# Patient Record
Sex: Female | Born: 1975
Health system: Southern US, Community
[De-identification: ages and names within clinical notes are randomized; demographics above are authoritative.]

## PROBLEM LIST (undated history)

## (undated) DIAGNOSIS — T7840XA Allergy, unspecified, initial encounter: Secondary | ICD-10-CM

## (undated) DIAGNOSIS — E785 Hyperlipidemia, unspecified: Secondary | ICD-10-CM

## (undated) DIAGNOSIS — E079 Disorder of thyroid, unspecified: Secondary | ICD-10-CM

## (undated) DIAGNOSIS — D649 Anemia, unspecified: Secondary | ICD-10-CM

## (undated) DIAGNOSIS — G43909 Migraine, unspecified, not intractable, without status migrainosus: Secondary | ICD-10-CM

## (undated) DIAGNOSIS — B019 Varicella without complication: Secondary | ICD-10-CM

## (undated) DIAGNOSIS — F419 Anxiety disorder, unspecified: Secondary | ICD-10-CM

## (undated) DIAGNOSIS — F329 Major depressive disorder, single episode, unspecified: Secondary | ICD-10-CM

## (undated) DIAGNOSIS — IMO0001 Reserved for inherently not codable concepts without codable children: Secondary | ICD-10-CM

## (undated) DIAGNOSIS — F32A Depression, unspecified: Secondary | ICD-10-CM

## (undated) HISTORY — DX: Anxiety disorder, unspecified: F41.9

## (undated) HISTORY — DX: Major depressive disorder, single episode, unspecified: F32.9

## (undated) HISTORY — DX: Allergy, unspecified, initial encounter: T78.40XA

## (undated) HISTORY — DX: Migraine, unspecified, not intractable, without status migrainosus: G43.909

## (undated) HISTORY — DX: Reserved for inherently not codable concepts without codable children: IMO0001

## (undated) HISTORY — PX: WISDOM TOOTH EXTRACTION: SHX21

## (undated) HISTORY — DX: Disorder of thyroid, unspecified: E07.9

## (undated) HISTORY — DX: Depression, unspecified: F32.A

## (undated) HISTORY — DX: Anemia, unspecified: D64.9

## (undated) HISTORY — DX: Hyperlipidemia, unspecified: E78.5

## (undated) HISTORY — DX: Varicella without complication: B01.9

---
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
## 2013-03-30 ENCOUNTER — Ambulatory Visit: Payer: Self-pay | Admitting: Medical

## 2013-08-28 ENCOUNTER — Ambulatory Visit (INDEPENDENT_AMBULATORY_CARE_PROVIDER_SITE_OTHER): Payer: BC Managed Care – PPO | Admitting: Internal Medicine

## 2013-08-28 ENCOUNTER — Other Ambulatory Visit (HOSPITAL_COMMUNITY)
Admission: RE | Admit: 2013-08-28 | Discharge: 2013-08-28 | Disposition: A | Discharge status: Home / Self Care | Payer: BC Managed Care – PPO | Source: Ambulatory Visit | Attending: Internal Medicine | Admitting: Internal Medicine

## 2013-08-28 ENCOUNTER — Encounter: Payer: Self-pay | Admitting: Internal Medicine

## 2013-08-28 VITALS — BP 120/76 | HR 77 | Temp 98.0°F | Ht 62.0 in | Wt 128.5 lb

## 2013-08-28 DIAGNOSIS — Z0000 Encounter for general adult medical examination without abnormal findings: Secondary | ICD-10-CM

## 2013-08-28 DIAGNOSIS — E039 Hypothyroidism, unspecified: Secondary | ICD-10-CM | POA: Insufficient documentation

## 2013-08-28 DIAGNOSIS — Z1151 Encounter for screening for human papillomavirus (HPV): Secondary | ICD-10-CM | POA: Insufficient documentation

## 2013-08-28 DIAGNOSIS — A499 Bacterial infection, unspecified: Secondary | ICD-10-CM

## 2013-08-28 DIAGNOSIS — B379 Candidiasis, unspecified: Secondary | ICD-10-CM

## 2013-08-28 DIAGNOSIS — J309 Allergic rhinitis, unspecified: Secondary | ICD-10-CM | POA: Insufficient documentation

## 2013-08-28 DIAGNOSIS — B9689 Other specified bacterial agents as the cause of diseases classified elsewhere: Secondary | ICD-10-CM

## 2013-08-28 DIAGNOSIS — N760 Acute vaginitis: Secondary | ICD-10-CM

## 2013-08-28 DIAGNOSIS — Z124 Encounter for screening for malignant neoplasm of cervix: Secondary | ICD-10-CM

## 2013-08-28 MED ORDER — LEVOTHYROXINE SODIUM 112 MCG PO TABS: 112.0000 ug | ORAL_TABLET | Freq: Every day | ORAL | Status: DC

## 2013-08-28 MED ORDER — ETONOGESTREL-ETHINYL ESTRADIOL 0.12-0.015 MG/24HR VA RING: VAGINAL_RING | VAGINAL | Status: DC

## 2013-08-28 MED ORDER — ONDANSETRON HCL 8 MG PO TABS: 8.0000 mg | ORAL_TABLET | ORAL | Status: DC | PRN

## 2013-08-28 MED ORDER — FLUCONAZOLE 150 MG PO TABS: 150.0000 mg | ORAL_TABLET | Freq: Once | ORAL | Status: DC

## 2013-08-28 MED ORDER — FLUTICASONE PROPIONATE 50 MCG/ACT NA SUSP: 2.0000 | Freq: Every day | NASAL | Status: DC

## 2013-08-28 MED ORDER — METRONIDAZOLE 0.75 % EX GEL: 1.0000 "application " | Freq: Two times a day (BID) | CUTANEOUS | Status: DC

## 2013-08-28 MED ORDER — BUDESONIDE-FORMOTEROL FUMARATE 160-4.5 MCG/ACT IN AERO: 2.0000 | INHALATION_SPRAY | Freq: Two times a day (BID) | RESPIRATORY_TRACT | Status: DC

## 2013-08-28 NOTE — Addendum Note (Signed)
Addended by: Lurlean Nanny on: 08/28/2013 04:49 PM   Modules accepted: Orders

## 2013-08-28 NOTE — Progress Notes (Signed)
Pre visit review using our clinic review tool, if applicable. No additional management support is needed unless otherwise documented below in the visit note. 

## 2013-08-28 NOTE — Progress Notes (Signed)
HPI Pt presents to the clinic today to establish care. She moved from [place] in August 2014. She has not established care since that time. She does have some concerns today about vaginal discharge, itching and odor. She noticed this 1 week ago. She denies abnormal bleeding or pain with intercourse.  Flu: 02/2013 Tetanus: 2011 Pap Smear: 2012 Eye doctor: yearly Dentist: biannually  Past Medical History  Diagnosis Date  . Allergy   . Depression   . Thyroid disease   . Migraines   . Chicken pox     Current Outpatient Prescriptions  Medication Sig Dispense Refill  . budesonide-formoterol (SYMBICORT) 160-4.5 MCG/ACT inhaler Inhale 2 puffs into the lungs 2 (two) times daily.      . Cholecalciferol (VITAMIN D3) 2000 UNITS TABS Take 2 capsules by mouth. 2 times a week      . DiphenhydrAMINE HCl (BENADRYL ALLERGY PO) Take 1 capsule by mouth as needed.      . etonogestrel-ethinyl estradiol (NUVARING) 0.12-0.015 MG/24HR vaginal ring Place 1 each vaginally every 28 (twenty-eight) days. Insert vaginally and leave in place for 3 consecutive weeks, then remove for 1 week.      . fexofenadine (ALLEGRA) 180 MG tablet Take 180 mg by mouth daily.      . Flaxseed, Linseed, (FLAXSEED OIL PO) Take 1 capsule by mouth 2 (two) times daily.      Derald Macleod Factor (INTRINSI B12-FOLATE) 409-811-91 MCG-MCG-MG TABS Take 1 capsule by mouth daily.      Marland Kitchen ibuprofen (ADVIL,MOTRIN) 400 MG tablet Take 400 mg by mouth as needed.      Marland Kitchen levothyroxine (SYNTHROID, LEVOTHROID) 112 MCG tablet Take 112 mcg by mouth daily before breakfast.      . MAGNESIUM GLYCINATE PLUS PO Take 1 tablet by mouth daily.      Marland Kitchen NIACINAMIDE-ZINC-COPPER-FA PO Take 1 capsule by mouth daily.      . ondansetron (ZOFRAN) 8 MG tablet Take 8 mg by mouth as needed for nausea or vomiting.       No current facility-administered medications for this visit.    No Known Allergies  Family History  Problem Relation Age of Onset  . Heart  disease Father   . Hypertension Father   . Stroke Maternal Grandmother   . Stroke Paternal Grandfather   . Cancer Neg Hx   . Diabetes Neg Hx     History   Social History  . Marital Status: Married    Spouse Name: N/A    Number of Children: N/A  . Years of Education: N/A   Occupational History  . Not on file.   Social History Main Topics  . Smoking status: Never Smoker   . Smokeless tobacco: Not on file  . Alcohol Use: 3.0 oz/week    5 Glasses of wine per week     Comment: moderate  . Drug Use: No  . Sexual Activity: Yes   Other Topics Concern  . Not on file   Social History Narrative  . No narrative on file    ROS:  Constitutional: Denies fever, malaise, fatigue, headache or abrupt weight changes.  HEENT: Denies eye pain, eye redness, ear pain, ringing in the ears, wax buildup, runny nose, nasal congestion, bloody nose, or sore throat. Respiratory: Denies difficulty breathing, shortness of breath, cough or sputum production.   Cardiovascular: Denies chest pain, chest tightness, palpitations or swelling in the hands or feet.  Gastrointestinal: Denies abdominal pain, bloating, constipation, diarrhea or blood in the stool.  GU:  Pt reports vaginal odor and discharge. Denies frequency, urgency, pain with urination, blood in urine. Musculoskeletal: Denies decrease in range of motion, difficulty with gait, muscle pain or joint pain and swelling.  Skin: Denies redness, rashes, lesions or ulcercations.  Neurological: Denies dizziness, difficulty with memory, difficulty with speech or problems with balance and coordination.   No other specific complaints in a complete review of systems (except as listed in HPI above).  PE:  BP 120/76  Pulse 77  Temp(Src) 98 F (36.7 C) (Oral)  Ht 5\' 2"  (1.575 m)  Wt 128 lb 8 oz (58.287 kg)  BMI 23.50 kg/m2  SpO2 98%  LMP 08/04/2013 Wt Readings from Last 3 Encounters:  08/28/13 128 lb 8 oz (58.287 kg)    Constitutional:  Alert,  oriented x 4, well developed, well nourished in no apparent distress. Skin: Skin is warm and dry.  No erythema, lesion or ulceration noted. HEENT: Head: normal shape and size; Eyes: sclera white, no icterus, conjunctiva pink, PERRLA and EOMs intact; Ears: Tm's gray and intact, normal light reflex; Nose: mucosa pink and moist, septum midline; Throat/Mouth: Teeth present, , mucosa pink and moist, no lesions or ulcerations noted. Neck: Normal range of motion. Neck supple, trachea midline. No massses, lumps or thyromegaly present.  Cardiovascular: Normal rate and rhythm. S1,S2 noted.  No murmur, rubs or gallops noted. No JVD or BLE edema. No carotid bruits noted. Pulmonary/Chest: Normal effort and positive vesicular breath sounds. No respiratory distress. No wheezes, rales or ronchi noted.  Abdomen: Soft and nontender. Normal bowel sounds, no bruits noted. No distention or masses noted. Liver, spleen and kidneys non palpable. Genitourinary: Normal female anatomy. Uterus midline, anterior and soft. No CMT . Some cervical erosion noted. Thin white discharge noted with positive odor.  Adenexa non palpable. Breast without lumps or masses.  Musculoskeletal: Normal range of motion. Patient exhibits no effusions.  Neurological: Alert and oriented. Cranial nerves II-XII intact. Coordination normal. +DTRs bilaterally. Psychiatric: She has a normal mood and affect. Behavior is normal. Judgment and thought content normal.      Assessment and Plan:  Preventative Health Maintenance:  She brought labs for me to review, all normal Pap smear obtained today Wet prep: pos yeast and clue cells  RTC in 6 months or sooner if needed

## 2013-08-28 NOTE — Patient Instructions (Addendum)

## 2013-08-31 ENCOUNTER — Telehealth: Payer: Self-pay

## 2013-08-31 ENCOUNTER — Other Ambulatory Visit: Payer: Self-pay | Admitting: Internal Medicine

## 2013-08-31 MED ORDER — METRONIDAZOLE 0.75 % VA GEL: 1.0000 | Freq: Two times a day (BID) | VAGINAL | Status: DC

## 2013-08-31 NOTE — Telephone Encounter (Signed)
Pt left v/m;pt was seen 08/28/13 and thought a suppository was going to be sent to pharmacy; when picked up med it was a topical cream and pt wanted to verify she is to use cream instead of suppository. Pt request cb.

## 2013-08-31 NOTE — Telephone Encounter (Signed)
She needs to internal cream/suppository. I have called it in

## 2013-08-31 NOTE — Telephone Encounter (Signed)
Left detailed msg on VM per HIPAA letting pt know her Rx was sent to the pharmacy

## 2013-09-02 ENCOUNTER — Telehealth: Payer: Self-pay

## 2013-09-02 NOTE — Telephone Encounter (Signed)
PA has been sent through [email or web address]--awaiting response

## 2013-09-09 ENCOUNTER — Telehealth: Payer: Self-pay

## 2013-09-09 NOTE — Telephone Encounter (Signed)
Insurance denied Symbicort and states pt must try and fail Advair Diskus or Breo Ellipta--please advise--denial placed in your inbox for your review--i do not need the notification

## 2013-09-10 NOTE — Telephone Encounter (Signed)
Try advair dose 250/50 one inhalation BID, 1 inhaler 2 refills

## 2013-09-11 MED ORDER — FLUTICASONE-SALMETEROL 250-50 MCG/DOSE IN AEPB: 1.0000 | INHALATION_SPRAY | Freq: Two times a day (BID) | RESPIRATORY_TRACT | Status: DC

## 2013-09-11 NOTE — Addendum Note (Signed)
Addended by: Lurlean Nanny on: 09/11/2013 04:39 PM   Modules accepted: Orders

## 2013-09-11 NOTE — Telephone Encounter (Signed)
New Rx sent to pharmacy

## 2013-09-14 NOTE — Telephone Encounter (Signed)
Error

## 2013-10-26 ENCOUNTER — Other Ambulatory Visit: Payer: Self-pay

## 2013-10-26 DIAGNOSIS — J309 Allergic rhinitis, unspecified: Secondary | ICD-10-CM

## 2013-10-26 MED ORDER — FLUTICASONE PROPIONATE 50 MCG/ACT NA SUSP: 2.0000 | Freq: Every day | NASAL | Status: DC

## 2013-10-26 NOTE — Telephone Encounter (Signed)
Requesting 90 day--sent to pharmacy with no additional refills

## 2013-11-23 ENCOUNTER — Ambulatory Visit (INDEPENDENT_AMBULATORY_CARE_PROVIDER_SITE_OTHER): Payer: BC Managed Care – PPO | Admitting: Internal Medicine

## 2013-11-23 ENCOUNTER — Encounter: Payer: Self-pay | Admitting: Internal Medicine

## 2013-11-23 VITALS — BP 112/78 | HR 86 | Temp 98.6°F | Wt 133.0 lb

## 2013-11-23 DIAGNOSIS — R21 Rash and other nonspecific skin eruption: Secondary | ICD-10-CM | POA: Insufficient documentation

## 2013-11-23 MED ORDER — HYDROCORTISONE 2.5 % EX CREA: TOPICAL_CREAM | Freq: Three times a day (TID) | CUTANEOUS | Status: DC | PRN

## 2013-11-23 NOTE — Assessment & Plan Note (Signed)
Doesn't look like contact and not sunburn Most likely photosensitivity rash in spots where sun protection wasn't adequate  Will give stronger cortisone cream reassured

## 2013-11-23 NOTE — Progress Notes (Signed)
Subjective:    Patient ID: Rachael Holloway, female    DOB: June 16, 1975, 38 y.o.   MRN: [id number]  HPI Awoke 2 days ago with raised itchy rash On face and back of neck Some on chest  No further spreading since initial presentation  Did try new sunscreen 3 days ago Rakes some leaves under magnolias---no clear touch with plants Also made batch of pesto---no known problem with pine nuts No rash on arms---despite using the sun screen there  Tried sarna cream and OTC hydrocortisone  Current Outpatient Prescriptions on File Prior to Visit  Medication Sig Dispense Refill  . Cholecalciferol (VITAMIN D3) 2000 UNITS TABS Take 2 capsules by mouth. 2 times a week      . DiphenhydrAMINE HCl (BENADRYL ALLERGY PO) Take 1 capsule by mouth as needed.      . etonogestrel-ethinyl estradiol (NUVARING) 0.12-0.015 MG/24HR vaginal ring Insert vaginally and leave in place for 3 consecutive weeks, then remove for 1 week.  3 each  5  . fexofenadine (ALLEGRA) 180 MG tablet Take 180 mg by mouth daily.      . Flaxseed, Linseed, (FLAXSEED OIL PO) Take 1 capsule by mouth 2 (two) times daily.      . fluticasone (FLONASE) 50 MCG/ACT nasal spray Place 2 sprays into both nostrils daily.  48 g  0  . Folate-B12-Intrinsic Factor (INTRINSI B12-FOLATE) 219-758-83 MCG-MCG-MG TABS Take 1 capsule by mouth daily.      Marland Kitchen ibuprofen (ADVIL,MOTRIN) 400 MG tablet Take 400 mg by mouth as needed.      Marland Kitchen levothyroxine (SYNTHROID, LEVOTHROID) 112 MCG tablet Take 1 tablet (112 mcg total) by mouth daily before breakfast.  30 tablet  5  . MAGNESIUM GLYCINATE PLUS PO Take 1 tablet by mouth daily.      Marland Kitchen NIACINAMIDE-ZINC-COPPER-FA PO Take 1 capsule by mouth daily.      . ondansetron (ZOFRAN) 8 MG tablet Take 1 tablet (8 mg total) by mouth as needed for nausea or vomiting.  20 tablet  1   No current facility-administered medications on file prior to visit.    No Known Allergies  Past Medical History  Diagnosis Date  . Allergy   .  Depression   . Thyroid disease   . Migraines   . Chicken pox     Past Surgical History  Procedure Laterality Date  . Ear tube removal  1985    Family History  Problem Relation Age of Onset  . Heart disease Father   . Hypertension Father   . Stroke Maternal Grandmother   . Stroke Paternal Grandfather   . Cancer Neg Hx   . Diabetes Neg Hx     History   Social History  . Marital Status: Married    Spouse Name: N/A    Number of Children: N/A  . Years of Education: N/A   Occupational History  . Not on file.   Social History Main Topics  . Smoking status: Never Smoker   . Smokeless tobacco: Never Used  . Alcohol Use: 3.0 oz/week    5 Glasses of wine per week     Comment: moderate  . Drug Use: No  . Sexual Activity: Yes   Other Topics Concern  . Not on file   Social History Narrative  . No narrative on file   Review of Systems Does tend to be sensitive to the sun    Objective:   Physical Exam  Constitutional: She appears well-developed and well-nourished. No distress.  Skin:  Confluent red, palpable but not really raised rash--only right cheek, arc on upper back (like T-shirt line) and just above breasts in midline          Assessment & Plan:

## 2013-11-23 NOTE — Progress Notes (Signed)
Pre visit review using our clinic review tool, if applicable. No additional management support is needed unless otherwise documented below in the visit note. 

## 2014-01-25 ENCOUNTER — Other Ambulatory Visit: Payer: Self-pay | Admitting: Internal Medicine

## 2014-03-12 ENCOUNTER — Telehealth: Payer: Self-pay

## 2014-03-12 NOTE — Telephone Encounter (Signed)
If she had the appropriate labs (THS and free T4) done at [facility], will not need new labs, she should bring her labs with her. Ok to give 1 month supply of synthroid so that she does not run out.

## 2014-03-12 NOTE — Telephone Encounter (Signed)
Pt states she understands--pt has not had blood work done already--she was just trying to understand if she needed labs before her appt etc..the patient advised to keep appt and i will send in 30 day supply--pt voiced understanding

## 2014-03-12 NOTE — Telephone Encounter (Signed)
Pt has 2 weeks of levothyroxine and request refill; pt was to schedule 6 mth f/u from 08/28/13 appt. Pt scheduled appt 03/19/14 at 2:15 pm with Webb Silversmith NP; (1st appt that was convenient for pt). Pt request cb; pt wants to know if will need labs prior to appt; pt has labs done at [facility]. Please advise.

## 2014-03-19 ENCOUNTER — Ambulatory Visit (INDEPENDENT_AMBULATORY_CARE_PROVIDER_SITE_OTHER): Payer: BC Managed Care – PPO | Admitting: Internal Medicine

## 2014-03-19 ENCOUNTER — Encounter: Payer: Self-pay | Admitting: Internal Medicine

## 2014-03-19 VITALS — BP 124/72 | HR 78 | Temp 98.7°F | Wt 134.5 lb

## 2014-03-19 DIAGNOSIS — G43909 Migraine, unspecified, not intractable, without status migrainosus: Secondary | ICD-10-CM | POA: Insufficient documentation

## 2014-03-19 DIAGNOSIS — F32A Depression, unspecified: Secondary | ICD-10-CM

## 2014-03-19 DIAGNOSIS — G43019 Migraine without aura, intractable, without status migrainosus: Secondary | ICD-10-CM

## 2014-03-19 DIAGNOSIS — Z3009 Encounter for other general counseling and advice on contraception: Secondary | ICD-10-CM

## 2014-03-19 DIAGNOSIS — F329 Major depressive disorder, single episode, unspecified: Secondary | ICD-10-CM

## 2014-03-19 DIAGNOSIS — F419 Anxiety disorder, unspecified: Secondary | ICD-10-CM

## 2014-03-19 DIAGNOSIS — Z0000 Encounter for general adult medical examination without abnormal findings: Secondary | ICD-10-CM

## 2014-03-19 DIAGNOSIS — Z1239 Encounter for other screening for malignant neoplasm of breast: Secondary | ICD-10-CM

## 2014-03-19 DIAGNOSIS — E038 Other specified hypothyroidism: Secondary | ICD-10-CM

## 2014-03-19 MED ORDER — LEVOTHYROXINE SODIUM 112 MCG PO TABS: 112.0000 ug | ORAL_TABLET | Freq: Every day | ORAL | Status: DC

## 2014-03-19 MED ORDER — NORETHINDRONE 0.35 MG PO TABS: 1.0000 | ORAL_TABLET | Freq: Every day | ORAL | Status: DC

## 2014-03-19 MED ORDER — ONDANSETRON HCL 8 MG PO TABS: 8.0000 mg | ORAL_TABLET | ORAL | Status: DC | PRN

## 2014-03-19 NOTE — Addendum Note (Signed)
Addended by: Jearld Fenton on: 03/19/2014 03:16 PM   Modules accepted: Orders

## 2014-03-19 NOTE — Assessment & Plan Note (Signed)
Sinus related Controlled on flonase and antihistamine

## 2014-03-19 NOTE — Progress Notes (Signed)
Subjective:    Patient ID: Rachael Holloway, female    DOB: 03/13/1976, 38 y.o.   MRN: [id number]  HPI  Pt presents to the clinic today for 6 month followup.  Depression: Reports it may be slightly worse. Thinks it may due to decreased libido. Has very low desire to have sex with her husband. Has been on Lexapro in the past. Weaned herself of of it 1 year ago. Does not want to be started back on any medication at this time.  Hypothyroidism: Has noticed some weight gain but otherwise stable on current dose of synthroid.  Migraines: Sinus related. Worse when the weather changes. Takes antihistamine and Flonase daily which helps.  Contraceptive counseling: has noticed frequent yeast infections since starting Nuvaring. Also had BV which is something she had never had before. OCP's typically cause severe nausea. Has heard about mini pill and is interested in trying that.  Also, she has turned 38 since I last saw her. She was wondering if she could go ahead and get her mammogram done as opposed to waiting until her physical exam next year to schedule it. She does have a history of breast cancer in her first cousin.  Review of Systems      Past Medical History  Diagnosis Date  . Allergy   . Depression   . Thyroid disease   . Migraines   . Chicken pox     Current Outpatient Prescriptions  Medication Sig Dispense Refill  . Cholecalciferol (VITAMIN D3) 2000 UNITS TABS Take 2 capsules by mouth. 2 times a week      . DiphenhydrAMINE HCl (BENADRYL ALLERGY PO) Take 1 capsule by mouth as needed.      . etonogestrel-ethinyl estradiol (NUVARING) 0.12-0.015 MG/24HR vaginal ring Insert vaginally and leave in place for 3 consecutive weeks, then remove for 1 week.  3 each  5  . fexofenadine (ALLEGRA) 180 MG tablet Take 180 mg by mouth daily.      . Flaxseed, Linseed, (FLAXSEED OIL PO) Take 1 capsule by mouth 2 (two) times daily.      . fluticasone (FLONASE) 50 MCG/ACT nasal spray Place 2 sprays  into both nostrils daily.  16 g  1  . Folate-B12-Intrinsic Factor (INTRINSI B12-FOLATE) 768-088-11 MCG-MCG-MG TABS Take 1 capsule by mouth daily.      . hydrocortisone 2.5 % cream Apply topically 3 (three) times daily as needed.  30 g  0  . ibuprofen (ADVIL,MOTRIN) 400 MG tablet Take 400 mg by mouth as needed.      Marland Kitchen levothyroxine (SYNTHROID, LEVOTHROID) 112 MCG tablet Take 1 tablet (112 mcg total) by mouth daily before breakfast.  30 tablet  0  . MAGNESIUM GLYCINATE PLUS PO Take 1 tablet by mouth daily.      Marland Kitchen NIACINAMIDE-ZINC-COPPER-FA PO Take 1 capsule by mouth daily.      . ondansetron (ZOFRAN) 8 MG tablet Take 1 tablet (8 mg total) by mouth as needed for nausea or vomiting.  20 tablet  1   No current facility-administered medications for this visit.    No Known Allergies  Family History  Problem Relation Age of Onset  . Heart disease Father   . Hypertension Father   . Stroke Maternal Grandmother   . Stroke Paternal Grandfather   . Cancer Neg Hx   . Diabetes Neg Hx     History   Social History  . Marital Status: Married    Spouse Name: N/A    Number of Children:  N/A  . Years of Education: N/A   Occupational History  . Not on file.   Social History Main Topics  . Smoking status: Never Smoker   . Smokeless tobacco: Never Used  . Alcohol Use: 3.0 oz/week    5 Glasses of wine per week     Comment: moderate  . Drug Use: No  . Sexual Activity: Yes   Other Topics Concern  . Not on file   Social History Narrative  . No narrative on file     Constitutional: Denies fever, malaise, fatigue, headache or abrupt weight changes.  HEENT: Denies eye pain, eye redness, ear pain, ringing in the ears, wax buildup, runny nose, nasal congestion, bloody nose, or sore throat. Respiratory: Denies difficulty breathing, shortness of breath, cough or sputum production.   Cardiovascular: Denies chest pain, chest tightness, palpitations or swelling in the hands or feet.    Gastrointestinal: Pt reports occasional nausea.Denies abdominal pain, bloating, constipation, diarrhea or blood in the stool.  GU: Pt reports frequent yeast infections and low libido. Denies urgency, frequency, pain with urination, burning sensation, blood in urine, odor or discharge.  No other specific complaints in a complete review of systems (except as listed in HPI above).  Objective:   Physical Exam   BP 124/72  Pulse 78  Temp(Src) 98.7 F (37.1 C) (Oral)  Wt 134 lb 8 oz (61.009 kg)  SpO2 98% Wt Readings from Last 3 Encounters:  03/19/14 134 lb 8 oz (61.009 kg)  11/23/13 133 lb (60.328 kg)  08/28/13 128 lb 8 oz (58.287 kg)    General: Appears her stated age, well developed, well nourished in NAD. HEENT: Head: normal shape and size; Eyes: sclera white, no icterus, conjunctiva pink, PERRLA and EOMs intact;Throat/Mouth: Teeth present, mucosa pink and moist, no exudate, lesions or ulcerations noted.  Neck: Normal range of motion. Neck supple, trachea midline.  Cardiovascular: Normal rate and rhythm. S1,S2 noted.  No murmur, rubs or gallops noted.  Pulmonary/Chest: Normal effort and positive vesicular breath sounds. No respiratory distress. No wheezes, rales or ronchi noted.  Abdomen: Soft and nontender. Normal bowel sounds, no bruits noted. No distention or masses noted. Liver, spleen and kidneys non palpable. Psychiatric: Mood tearful and affect normal. Behavior is normal. Judgment and thought content normal.        Assessment & Plan:   Screening for breast cancer:  Mammogram ordered She would like to have this done at [facility]  Contraceptive counseling:  Frequent yeast infections likely related to [place] Discussed trying OCP again versus IUD She has read up about mini pill and would like to try that RX for minipill sent to pharmacy RX for zofran sent to pharmacy for hormonal induced nausea  RTC in 6 months for your physical exam

## 2014-03-19 NOTE — Progress Notes (Signed)
Pre visit review using our clinic review tool, if applicable. No additional management support is needed unless otherwise documented below in the visit note. 

## 2014-03-19 NOTE — Assessment & Plan Note (Signed)
Will check TSH and free T4 (likes to have done at [facility]) They will fax me the records Will adjust synthroid as needed

## 2014-03-19 NOTE — Assessment & Plan Note (Signed)
Slightly worse Support offered today She does not want to start medication at this time

## 2014-04-19 ENCOUNTER — Ambulatory Visit: Payer: Self-pay | Admitting: Internal Medicine

## 2014-04-19 ENCOUNTER — Encounter: Payer: Self-pay | Admitting: Internal Medicine

## 2014-04-21 ENCOUNTER — Other Ambulatory Visit: Payer: Self-pay | Admitting: Internal Medicine

## 2014-04-21 NOTE — Telephone Encounter (Signed)
Pt left v/m requesting refill levothyroxine to [facility]. Pt also has not received lab results from 03/24/14. Left detailed v/m with lab results noted on scanned 03/24/14 lab test that no change in synthroid dosage and that refill was done.

## 2014-06-25 ENCOUNTER — Ambulatory Visit (INDEPENDENT_AMBULATORY_CARE_PROVIDER_SITE_OTHER): Payer: BLUE CROSS/BLUE SHIELD | Admitting: Podiatry

## 2014-06-25 ENCOUNTER — Encounter: Payer: Self-pay | Admitting: Podiatry

## 2014-06-25 ENCOUNTER — Ambulatory Visit (INDEPENDENT_AMBULATORY_CARE_PROVIDER_SITE_OTHER): Payer: BLUE CROSS/BLUE SHIELD

## 2014-06-25 VITALS — BP 123/83 | HR 80 | Resp 16

## 2014-06-25 DIAGNOSIS — M779 Enthesopathy, unspecified: Secondary | ICD-10-CM

## 2014-06-25 DIAGNOSIS — M2040 Other hammer toe(s) (acquired), unspecified foot: Secondary | ICD-10-CM

## 2014-06-25 NOTE — Progress Notes (Signed)
   Subjective:    Patient ID: Rachael Holloway, female    DOB: 1975-12-11, 39 y.o.   MRN: [id number]  HPI Comments: "I have pain in the ball of my foot"  Patient c/o aching plantar forefoot bilateral for many years, approx since 1990's. 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 She has had many orthotics over the years. Notices that she is walking on the outside of her foot. She loves to hike but has been unable to. High heels are uncomfortable. She wants to know if there are any other options for her.  Foot Pain Associated symptoms include arthralgias.      Review of Systems  HENT: Positive for sinus pressure.   Musculoskeletal: Positive for arthralgias.  Allergic/Immunologic: Positive for environmental allergies.  All other systems reviewed and are negative.      Objective:   Physical Exam        Assessment & Plan:

## 2014-06-27 NOTE — Progress Notes (Signed)
Subjective:     Patient ID: Rachael Holloway, female   DOB: Oct 10, 1975, 39 y.o.   MRN: [id number]  HPI patient states I've had pain in my feet for around 25 years. States the vast majority of the pain has been in the forefoot and it makes ambulation difficult and it makes walking difficult. States she does not remember any specific treatments that a been effective except for utilizing orthotics which are 41 years old at this time   Review of Systems  All other systems reviewed and are negative.      Objective:   Physical Exam  Constitutional: She is oriented to person, place, and time.  Cardiovascular: Intact distal pulses.   Musculoskeletal: Normal range of motion.  Neurological: She is oriented to person, place, and time.  Skin: Skin is warm.  Nursing note and vitals reviewed.  neurovascular status intact with muscle strength adequate and range of motion subtalar midtarsal joint within normal limits. Patient's noted to have discomfort in the lesser MPJs of both feet that's mild in nature with no indications of swelling of the lesser MPJs. No equinus condition was noted digits are well-perfused and she's well oriented 3. I was unable to ascertain any pain between the metatarsals or any indications of nerve compression at this current time     Assessment:     Appears to be low grade metatarsalgia which may be due to just chronic foot slapping with no indications of extensive or bone disease    Plan:     H&P and x-rays reviewed. I have recommended long-term orthotics to try to diffuse the forces off the underlying metatarsals and explained that I did not note anything on x-ray to explain why pain has been present for so many years. Do not see any other forms of aggressive treatment that could be effective to help this patient. Scanned for custom orthotics

## 2014-07-16 ENCOUNTER — Ambulatory Visit (INDEPENDENT_AMBULATORY_CARE_PROVIDER_SITE_OTHER): Payer: BLUE CROSS/BLUE SHIELD | Admitting: *Deleted

## 2014-07-16 DIAGNOSIS — M779 Enthesopathy, unspecified: Secondary | ICD-10-CM

## 2014-07-16 NOTE — Patient Instructions (Signed)

## 2014-07-16 NOTE — Progress Notes (Signed)
Orthotics dispensed. Given instructions for breakin. 1 mo. for checkup.

## 2014-08-20 ENCOUNTER — Ambulatory Visit (INDEPENDENT_AMBULATORY_CARE_PROVIDER_SITE_OTHER): Payer: BLUE CROSS/BLUE SHIELD | Admitting: Podiatry

## 2014-08-20 DIAGNOSIS — M779 Enthesopathy, unspecified: Secondary | ICD-10-CM | POA: Diagnosis not present

## 2014-08-23 NOTE — Progress Notes (Signed)
Subjective:     Patient ID: Rachael Holloway, female   DOB: 1976/02/25, 39 y.o.   MRN: [id number]  HPI patient presents with tendinitis-like symptomatology stating that orthotics have been giving her trouble and making it difficult for her to wear shoe gear without pain. States that she's tried different treatment options at this time without relief and wants to know what she can do   Review of Systems     Objective:   Physical Exam Neurovascular status intact with mild-like discomfort which is localized with no indications of inflammatory complex noted    Assessment:     Tendinitis with orthotic usage and currently not wearing what I would consider adequate shoes for this particular condition    Plan:     Recommended change in shoes and trying orthotics again. We may have to thin them if symptoms persist

## 2014-09-08 ENCOUNTER — Other Ambulatory Visit: Payer: Self-pay

## 2014-09-08 MED ORDER — FLUTICASONE PROPIONATE 50 MCG/ACT NA SUSP: 2.0000 | Freq: Every day | NASAL | Status: DC

## 2014-09-08 NOTE — Telephone Encounter (Signed)
Helyn App with [facility]. Left v/m requesting refill fluticasone nasal spray. Pt last seen 03/19/14 and pt was to return in 6 mths for f/u, no appt scheduled.Please advise.

## 2014-10-05 ENCOUNTER — Other Ambulatory Visit: Payer: Self-pay | Admitting: Internal Medicine

## 2014-10-05 DIAGNOSIS — Z0000 Encounter for general adult medical examination without abnormal findings: Secondary | ICD-10-CM

## 2014-10-05 DIAGNOSIS — E039 Hypothyroidism, unspecified: Secondary | ICD-10-CM

## 2014-10-07 ENCOUNTER — Other Ambulatory Visit (INDEPENDENT_AMBULATORY_CARE_PROVIDER_SITE_OTHER): Payer: BLUE CROSS/BLUE SHIELD

## 2014-10-07 DIAGNOSIS — Z0000 Encounter for general adult medical examination without abnormal findings: Secondary | ICD-10-CM

## 2014-10-07 DIAGNOSIS — E039 Hypothyroidism, unspecified: Secondary | ICD-10-CM | POA: Diagnosis not present

## 2014-10-07 LAB — LIPID PANEL
CHOLESTEROL: 179 mg/dL (ref 0–200)
HDL: 53.5 mg/dL (ref >39.00)
LDL CALC: 99 mg/dL (ref 0–99)
NonHDL: 125.5
TRIGLYCERIDES: 132 mg/dL (ref 0.0–149.0)
Total CHOL/HDL Ratio: 3
VLDL: 26.4 mg/dL (ref 0.0–40.0)

## 2014-10-07 LAB — COMPREHENSIVE METABOLIC PANEL
ALK PHOS: 78 U/L (ref 39–117)
ALT: 12 U/L (ref 0–35)
AST: 16 U/L (ref 0–37)
Albumin: 4 g/dL (ref 3.5–5.2)
BILIRUBIN TOTAL: 0.5 mg/dL (ref 0.2–1.2)
BUN: 8 mg/dL (ref 6–23)
CO2: 29 meq/L (ref 19–32)
CREATININE: 0.78 mg/dL (ref 0.40–1.20)
Calcium: 9.1 mg/dL (ref 8.4–10.5)
Chloride: 104 mEq/L (ref 96–112)
GFR: 86.62 mL/min (ref >60.00)
Glucose, Bld: 95 mg/dL (ref 70–99)
Potassium: 4 mEq/L (ref 3.5–5.1)
Sodium: 137 mEq/L (ref 135–145)
TOTAL PROTEIN: 6.6 g/dL (ref 6.0–8.3)

## 2014-10-07 LAB — CBC
HCT: 42.7 % (ref 36.0–46.0)
HEMOGLOBIN: 14.7 g/dL (ref 12.0–15.0)
MCHC: 34.5 g/dL (ref 30.0–36.0)
MCV: 91 fl (ref 78.0–100.0)
PLATELETS: 286 10*3/uL (ref 150.0–400.0)
RBC: 4.69 Mil/uL (ref 3.87–5.11)
RDW: 12.8 % (ref 11.5–15.5)
WBC: 8.2 10*3/uL (ref 4.0–10.5)

## 2014-10-07 LAB — TSH: TSH: 1.9 u[IU]/mL (ref 0.35–4.50)

## 2014-10-07 LAB — T4, FREE: Free T4: 1.15 ng/dL (ref 0.60–1.60)

## 2014-10-12 ENCOUNTER — Encounter: Payer: Self-pay | Admitting: Internal Medicine

## 2014-10-12 ENCOUNTER — Ambulatory Visit (INDEPENDENT_AMBULATORY_CARE_PROVIDER_SITE_OTHER): Payer: BLUE CROSS/BLUE SHIELD | Admitting: Internal Medicine

## 2014-10-12 VITALS — BP 124/84 | HR 83 | Temp 98.7°F | Ht 62.0 in | Wt 133.0 lb

## 2014-10-12 DIAGNOSIS — Z0000 Encounter for general adult medical examination without abnormal findings: Secondary | ICD-10-CM | POA: Diagnosis not present

## 2014-10-12 DIAGNOSIS — E039 Hypothyroidism, unspecified: Secondary | ICD-10-CM

## 2014-10-12 NOTE — Assessment & Plan Note (Signed)
TSH and Free T4 normal Will continue current dose of Synthroid

## 2014-10-12 NOTE — Patient Instructions (Signed)

## 2014-10-12 NOTE — Progress Notes (Signed)
Subjective:    Patient ID: Rachael Holloway, female    DOB: Aug 11, 1975, 39 y.o.   MRN: [id number]  HPI  Pt presents to the clinic today for her annual exam.  Flu: 02/2014 Tetanus: 2011 LMP: 09/27/14 Pap Smear: 07/2012 Mammogram: 03/2014 Vision Screening: annually Dentist: biannually  Review of Systems  Past Medical History  Diagnosis Date  . Allergy   . Depression   . Thyroid disease   . Migraines   . Chicken pox     Current Outpatient Prescriptions  Medication Sig Dispense Refill  . Cholecalciferol (VITAMIN D3) 2000 UNITS TABS Take 2 capsules by mouth. 2 times a week    . DiphenhydrAMINE HCl (BENADRYL ALLERGY PO) Take 1 capsule by mouth as needed.    . fexofenadine (ALLEGRA) 180 MG tablet Take 180 mg by mouth daily.    . Flaxseed, Linseed, (FLAXSEED OIL PO) Take 1 capsule by mouth 2 (two) times daily.    . fluticasone (FLONASE) 50 MCG/ACT nasal spray Place 2 sprays into both nostrils daily. 16 g 1  . Folate-B12-Intrinsic Factor (INTRINSI B12-FOLATE) 678-938-10 MCG-MCG-MG TABS Take 1 capsule by mouth daily.    Marland Kitchen ibuprofen (ADVIL,MOTRIN) 400 MG tablet Take 400 mg by mouth as needed.    Marland Kitchen levothyroxine (SYNTHROID, LEVOTHROID) 112 MCG tablet TAKE 1 TABLET (112 MCG TOTAL) BY MOUTH DAILY BEFORE BREAKFAST. 30 tablet 5  . MAGNESIUM GLYCINATE PLUS PO Take 1 tablet by mouth daily.    Marland Kitchen NIACINAMIDE-ZINC-COPPER-FA PO Take 1 capsule by mouth daily.    . norethindrone (MICRONOR,CAMILA,ERRIN) 0.35 MG tablet Take 1 tablet (0.35 mg total) by mouth daily. 1 Package 11   No current facility-administered medications for this visit.    Allergies  Allergen Reactions  . Mold Extract [Trichophyton]   . Pollen Extract     Family History  Problem Relation Age of Onset  . Heart disease Father   . Hypertension Father   . Stroke Maternal Grandmother   . Stroke Paternal Grandfather   . Cancer Neg Hx   . Diabetes Neg Hx     History   Social History  . Marital Status: Married   Spouse Name: N/A  . Number of Children: N/A  . Years of Education: N/A   Occupational History  . Not on file.   Social History Main Topics  . Smoking status: Never Smoker   . Smokeless tobacco: Never Used  . Alcohol Use: 3.0 oz/week    5 Glasses of wine per week     Comment: moderate  . Drug Use: No  . Sexual Activity: Yes   Other Topics Concern  . Not on file   Social History Narrative     Constitutional: Denies fever, malaise, fatigue, headache or abrupt weight changes.  HEENT: Denies eye pain, eye redness, ear pain, ringing in the ears, wax buildup, runny nose, nasal congestion, bloody nose, or sore throat. Respiratory: Denies difficulty breathing, shortness of breath, cough or sputum production.   Cardiovascular: Denies chest pain, chest tightness, palpitations or swelling in the hands or feet.  Gastrointestinal: Denies abdominal pain, bloating, constipation, diarrhea or blood in the stool.  GU: Pt reports decreased Libido. Denies urgency, frequency, pain with urination, burning sensation, blood in urine, odor or discharge. Musculoskeletal: Denies decrease in range of motion, difficulty with gait, muscle pain or joint pain and swelling.  Skin: Denies redness, rashes, lesions or ulcercations.  Neurological: Denies dizziness, difficulty with memory, difficulty with speech or problems with balance and coordination.  Psych:  Denies anxiety, depression, SI/HI.  No other specific complaints in a complete review of systems (except as listed in HPI above).     Objective:   Physical Exam   BP 124/84 mmHg  Pulse 83  Temp(Src) 98.7 F (37.1 C) (Oral)  Ht 5\' 2"  (1.575 m)  Wt 133 lb (60.328 kg)  BMI 24.32 kg/m2  SpO2 98% Wt Readings from Last 3 Encounters:  10/12/14 133 lb (60.328 kg)  03/19/14 134 lb 8 oz (61.009 kg)  11/23/13 133 lb (60.328 kg)    General: Appears her stated age, well developed, well nourished in NAD. Skin: Warm, dry and intact. No rashes, lesions or  ulcerations noted. HEENT: Head: normal shape and size; Eyes: sclera white, no icterus, conjunctiva pink, PERRLA and EOMs intact; Ears: Tm's gray and intact, normal light reflex; Throat/Mouth: Teeth present, mucosa pink and moist, no exudate, lesions or ulcerations noted.  Neck: Neck supple, trachea midline. No masses, lumps or thyromegaly present.  Cardiovascular: Normal rate and rhythm. S1,S2 noted.  No murmur, rubs or gallops noted. No JVD or BLE edema. No carotid bruits noted. Pulmonary/Chest: Normal effort and positive vesicular breath sounds. No respiratory distress. No wheezes, rales or ronchi noted.  Abdomen: Soft and nontender. Normal bowel sounds, no bruits noted. No distention or masses noted. Liver, spleen and kidneys non palpable. Musculoskeletal: Strength 5/5 BUE/BLE. No signs of joint swelling. No difficulty with gait.  Neurological: Alert and oriented. Cranial nerves II-XII grossly intact. Coordination normal.  Psychiatric: Mood and affect normal. Behavior is normal. Judgment and thought content normal.     BMET    Component Value Date/Time   NA 137 10/07/2014 1052   K 4.0 10/07/2014 1052   CL 104 10/07/2014 1052   CO2 29 10/07/2014 1052   GLUCOSE 95 10/07/2014 1052   BUN 8 10/07/2014 1052   CREATININE 0.78 10/07/2014 1052   CALCIUM 9.1 10/07/2014 1052    Lipid Panel     Component Value Date/Time   CHOL 179 10/07/2014 1052   TRIG 132.0 10/07/2014 1052   HDL 53.50 10/07/2014 1052   CHOLHDL 3 10/07/2014 1052   VLDL 26.4 10/07/2014 1052   LDLCALC 99 10/07/2014 1052    CBC    Component Value Date/Time   WBC 8.2 10/07/2014 1052   RBC 4.69 10/07/2014 1052   HGB 14.7 10/07/2014 1052   HCT 42.7 10/07/2014 1052   PLT 286.0 10/07/2014 1052   MCV 91.0 10/07/2014 1052   MCHC 34.5 10/07/2014 1052   RDW 12.8 10/07/2014 1052    Hgb A1C No results found for: HGBA1C      Assessment & Plan:   Preventative Health Maintenance:  Will do pap smear next year with her  annual exam She will get her Mammogram 03/2015 All other HM UTD CBC, CMET, Lipids all normal (drawn 10/07/14)  RTC in 6 months to follow up chronic medical conditions

## 2014-10-16 ENCOUNTER — Other Ambulatory Visit: Payer: Self-pay | Admitting: Internal Medicine

## 2014-10-22 ENCOUNTER — Ambulatory Visit: Payer: BLUE CROSS/BLUE SHIELD | Admitting: Podiatry

## 2014-10-22 ENCOUNTER — Ambulatory Visit (INDEPENDENT_AMBULATORY_CARE_PROVIDER_SITE_OTHER): Payer: BLUE CROSS/BLUE SHIELD | Admitting: Podiatry

## 2014-10-22 VITALS — BP 132/91 | HR 88 | Resp 16

## 2014-10-22 DIAGNOSIS — M779 Enthesopathy, unspecified: Secondary | ICD-10-CM | POA: Diagnosis not present

## 2014-10-22 NOTE — Progress Notes (Signed)
Subjective:     Patient ID: Rachael Holloway, female   DOB: 1975-08-26, 39 y.o.   MRN: [id number]  HPI patient states I'm doing pretty well but I cannot wear my orthotics all the time as I gets some burning in my forefeet and I get some shooting pains between the lesser digits that I'm concerned about   Review of Systems     Objective:   Physical Exam Neurovascular status intact muscle strength was adequate with no neurological changes noted. She does have a full orthotic and has a small foot and may be getting some forefoot compression    Assessment:     Tendinitis being helped dramatically by orthotics but they cannot be worn at all times due to full-length    Plan:     I have recommended partial orthotics for shoes with tight toe box and reviewed this as a necessary second orthotic for this patient. Patient wants to have them made and is sent out for a new type orthotic to only give her support in the arch and take pressure off her toes

## 2014-11-12 ENCOUNTER — Other Ambulatory Visit: Payer: Self-pay | Admitting: Internal Medicine

## 2015-02-11 ENCOUNTER — Other Ambulatory Visit: Payer: Self-pay | Admitting: Internal Medicine

## 2015-02-18 ENCOUNTER — Other Ambulatory Visit: Payer: Self-pay

## 2015-02-18 MED ORDER — LEVOTHYROXINE SODIUM 112 MCG PO TABS: 112.0000 ug | ORAL_TABLET | Freq: Every day | ORAL | Status: DC

## 2015-04-06 ENCOUNTER — Telehealth: Payer: Self-pay | Admitting: Internal Medicine

## 2015-04-06 ENCOUNTER — Other Ambulatory Visit: Payer: Self-pay | Admitting: Internal Medicine

## 2015-04-06 DIAGNOSIS — E039 Hypothyroidism, unspecified: Secondary | ICD-10-CM

## 2015-04-06 NOTE — Telephone Encounter (Signed)
Order mailed per pt request  

## 2015-04-06 NOTE — Telephone Encounter (Signed)
RX in [place]

## 2015-04-06 NOTE — Telephone Encounter (Signed)
Patient has an appointment for a follow up on 04/22/15.  Patient said she's suppose to have lab work done before the appointment.  Patient works at [employer] and wants to have her lab work drawn there.  Patient is asking for lab order to be mailed to her,so she can have lab work done at [employer].

## 2015-04-11 ENCOUNTER — Other Ambulatory Visit (INDEPENDENT_AMBULATORY_CARE_PROVIDER_SITE_OTHER): Payer: BLUE CROSS/BLUE SHIELD

## 2015-04-11 DIAGNOSIS — E039 Hypothyroidism, unspecified: Secondary | ICD-10-CM

## 2015-04-11 LAB — T4, FREE: Free T4: 1.01 ng/dL (ref 0.60–1.60)

## 2015-04-11 LAB — TSH: TSH: 1.05 u[IU]/mL (ref 0.35–4.50)

## 2015-04-11 NOTE — Addendum Note (Signed)
Addended by: Lurlean Nanny on: 04/11/2015 10:27 AM   Modules accepted: Orders

## 2015-04-12 ENCOUNTER — Other Ambulatory Visit: Payer: BLUE CROSS/BLUE SHIELD

## 2015-04-17 ENCOUNTER — Other Ambulatory Visit: Payer: Self-pay | Admitting: Internal Medicine

## 2015-04-22 ENCOUNTER — Encounter: Payer: Self-pay | Admitting: Internal Medicine

## 2015-04-22 ENCOUNTER — Ambulatory Visit (INDEPENDENT_AMBULATORY_CARE_PROVIDER_SITE_OTHER): Payer: BLUE CROSS/BLUE SHIELD | Admitting: Internal Medicine

## 2015-04-22 ENCOUNTER — Ambulatory Visit: Payer: BLUE CROSS/BLUE SHIELD | Admitting: Internal Medicine

## 2015-04-22 VITALS — BP 132/84 | HR 80 | Temp 98.1°F | Wt 130.0 lb

## 2015-04-22 DIAGNOSIS — A084 Viral intestinal infection, unspecified: Secondary | ICD-10-CM

## 2015-04-22 DIAGNOSIS — Z309 Encounter for contraceptive management, unspecified: Secondary | ICD-10-CM | POA: Diagnosis not present

## 2015-04-22 DIAGNOSIS — E039 Hypothyroidism, unspecified: Secondary | ICD-10-CM

## 2015-04-22 DIAGNOSIS — B070 Plantar wart: Secondary | ICD-10-CM | POA: Diagnosis not present

## 2015-04-22 DIAGNOSIS — Z3009 Encounter for other general counseling and advice on contraception: Secondary | ICD-10-CM

## 2015-04-22 MED ORDER — LEVOTHYROXINE SODIUM 112 MCG PO TABS: ORAL_TABLET | ORAL | Status: DC

## 2015-04-22 NOTE — Progress Notes (Signed)
Subjective:    Patient ID: Rachael Holloway, female    DOB: 05-Jul-1975, 39 y.o.   MRN: [id number]  HPI  Pt presents to the clinic today for 6 month follow up of Hypothyroidism. She is currently taking Synthroid 112 mcg daily. She denies weight gain, cold sensation, fatigue, constipation or dry skin. She had her levels checked 1 week ago, and they were normal.   She also wants to discuss her contraception. She feels like it is decreasing her sex drive. She has been on multiple other methods in the past, each of which she had side effects to, mostly nausea. She reports she has to have something done about this.   She also reports she has a plantar wart on the bottom of her foot. She is using an OTC Salicylic Acid treatment for now. She is not interested in any further intervention at this time.  She did have a bout of viral gastroenteritis last week. It only lasted about 24 hours. She denies any nausea, vomiting or diarrhea at this time.  Review of Systems      Past Medical History  Diagnosis Date  . Allergy   . Depression   . Thyroid disease   . Migraines   . Chicken pox     Current Outpatient Prescriptions  Medication Sig Dispense Refill  . Cholecalciferol (VITAMIN D3) 2000 UNITS TABS Take 2 capsules by mouth. 2 times a week    . DiphenhydrAMINE HCl (BENADRYL ALLERGY PO) Take 1 capsule by mouth as needed.    . fexofenadine (ALLEGRA) 180 MG tablet Take 180 mg by mouth daily.    . Flaxseed, Linseed, (FLAXSEED OIL PO) Take 1 capsule by mouth 2 (two) times daily.    . fluticasone (FLONASE) 50 MCG/ACT nasal spray PLACE 2 SPRAYS INTO BOTH NOSTRILS DAILY. 16 g 4  . Folate-B12-Intrinsic Factor (INTRINSI B12-FOLATE) [id number] MCG-MCG-MG TABS Take 1 capsule by mouth daily.    Marland Kitchen ibuprofen (ADVIL,MOTRIN) 400 MG tablet Take 400 mg by mouth as needed.    Marland Kitchen levothyroxine (SYNTHROID, LEVOTHROID) 112 MCG tablet TAKE 1 TABLET BY MOUTH EVERY MORNING BEFORE BREAKFAST 30 tablet 5  . MAGNESIUM  GLYCINATE PLUS PO Take 1 tablet by mouth daily.    Marland Kitchen NIACINAMIDE-ZINC-COPPER-FA PO Take 1 capsule by mouth daily.    . norethindrone (MICRONOR,CAMILA,ERRIN) 0.35 MG tablet TAKE 1 TABLET (0.35 MG TOTAL) BY MOUTH DAILY. 28 tablet 7   No current facility-administered medications for this visit.    Allergies  Allergen Reactions  . Mold Extract [Trichophyton]   . Pollen Extract     Family History  Problem Relation Age of Onset  . Heart disease Father   . Hypertension Father   . Stroke Maternal Grandmother   . Stroke Paternal Grandfather   . Cancer Neg Hx   . Diabetes Neg Hx     Social History   Social History  . Marital Status: Married    Spouse Name: N/A  . Number of Children: N/A  . Years of Education: N/A   Occupational History  . Not on file.   Social History Main Topics  . Smoking status: Never Smoker   . Smokeless tobacco: Never Used  . Alcohol Use: 3.0 oz/week    5 Glasses of wine per week     Comment: moderate  . Drug Use: No  . Sexual Activity: Yes   Other Topics Concern  . Not on file   Social History Narrative     Constitutional: Denies fever,  malaise, fatigue, headache or abrupt weight changes.  Respiratory: Denies difficulty breathing, shortness of breath, cough or sputum production.   Cardiovascular: Denies chest pain, chest tightness, palpitations or swelling in the hands or feet.  Gastrointestinal: Denies abdominal pain, bloating, constipation, diarrhea or blood in the stool.  GU: Pt reports decreased sex drive. Denies urgency, frequency, pain with urination, burning sensation, blood in urine, odor or discharge. Skin: Denies redness, rashes, lesions or ulcercations.  Neurological: Denies dizziness, difficulty with memory, difficulty with speech or problems with balance and coordination.  Psych: Denies anxiety, depression, SI/HI.  No other specific complaints in a complete review of systems (except as listed in HPI above).  Objective:   Physical  Exam   BP 132/84 mmHg  Pulse 80  Temp(Src) 98.1 F (36.7 C) (Oral)  Wt 130 lb (58.968 kg)  SpO2 99%  LMP 03/31/2015 Wt Readings from Last 3 Encounters:  04/22/15 130 lb (58.968 kg)  10/12/14 133 lb (60.328 kg)  03/19/14 134 lb 8 oz (61.009 kg)    General: Appears her stated age, well developed, well nourished in NAD. Neck:  Neck supple, trachea midline. No masses, lumps or thyromegaly present.  Cardiovascular: Normal rate and rhythm. S1,S2 noted.  No murmur, rubs or gallops noted.  Pulmonary/Chest: Normal effort and positive vesicular breath sounds. No respiratory distress. No wheezes, rales or ronchi noted.  Neurological: Alert and oriented.  Psychiatric: She is tearful during most of the interview.  BMET    Component Value Date/Time   NA 137 10/07/2014 1052   K 4.0 10/07/2014 1052   CL 104 10/07/2014 1052   CO2 29 10/07/2014 1052   GLUCOSE 95 10/07/2014 1052   BUN 8 10/07/2014 1052   CREATININE 0.78 10/07/2014 1052   CALCIUM 9.1 10/07/2014 1052    Lipid Panel     Component Value Date/Time   CHOL 179 10/07/2014 1052   TRIG 132.0 10/07/2014 1052   HDL 53.50 10/07/2014 1052   CHOLHDL 3 10/07/2014 1052   VLDL 26.4 10/07/2014 1052   LDLCALC 99 10/07/2014 1052    CBC    Component Value Date/Time   WBC 8.2 10/07/2014 1052   RBC 4.69 10/07/2014 1052   HGB 14.7 10/07/2014 1052   HCT 42.7 10/07/2014 1052   PLT 286.0 10/07/2014 1052   MCV 91.0 10/07/2014 1052   MCHC 34.5 10/07/2014 1052   RDW 12.8 10/07/2014 1052    Hgb A1C No results found for: HGBA1C      Assessment & Plan:   Decreased sex drive:  Discussed possible IUD Will refer to gyn to discuss further evaluation and treatment  Plantar wart:  Continue OTC treatment at this time Can consider liquid nitrogen if not improving  Viral Gastroenteritis:  Resolved Push fluids  RTC in 6 months for annual exam

## 2015-04-22 NOTE — Patient Instructions (Signed)

## 2015-04-22 NOTE — Progress Notes (Signed)
Pre visit review using our clinic review tool, if applicable. No additional management support is needed unless otherwise documented below in the visit note. 

## 2015-04-24 NOTE — Assessment & Plan Note (Signed)
Controlled on current dose of Synthroid Medication refilled today

## 2015-05-26 ENCOUNTER — Ambulatory Visit (INDEPENDENT_AMBULATORY_CARE_PROVIDER_SITE_OTHER): Payer: BLUE CROSS/BLUE SHIELD | Admitting: Obstetrics and Gynecology

## 2015-05-26 ENCOUNTER — Encounter: Payer: Self-pay | Admitting: Obstetrics and Gynecology

## 2015-05-26 VITALS — BP 163/71 | HR 94 | Ht 62.0 in | Wt 130.2 lb

## 2015-05-26 DIAGNOSIS — Z3009 Encounter for other general counseling and advice on contraception: Secondary | ICD-10-CM | POA: Diagnosis not present

## 2015-05-26 NOTE — Progress Notes (Signed)
Subjective:    TYTIYANA COOR is a 40 y.o. P0 female who presents for contraception counseling. Referred from [facility]. The patient has no complaints today. The patient is sexually active. Pertinent past medical history: none.  Menstrual History: OB History    Gravida Para Term Preterm AB TAB SAB Ectopic Multiple Living   0 0 0 0 0 0 0 0 0 0       Menarche age: 55  Patient's last menstrual period was 05/26/2015. Denies h/o STIs or abnormal pap smears.    Past Medical History  Diagnosis Date  . Allergy   . Depression   . Thyroid disease   . Migraines   . Chicken pox   . White coat hypertension     Family History  Problem Relation Age of Onset  . Heart disease Father   . Hypertension Father   . Stroke Maternal Grandmother   . Stroke Paternal Grandfather   . Cancer Neg Hx   . Diabetes Neg Hx     Past Surgical History  Procedure Laterality Date  . Ear tube removal  1985    Social History   Social History  . Marital Status: Married    Spouse Name: N/A  . Number of Children: N/A  . Years of Education: N/A   Occupational History  . Not on file.   Social History Main Topics  . Smoking status: Never Smoker   . Smokeless tobacco: Never Used  . Alcohol Use: 3.0 oz/week    5 Glasses of wine per week     Comment: moderate  . Drug Use: No  . Sexual Activity: Yes   Other Topics Concern  . Not on file   Social History Narrative    Current Outpatient Prescriptions on File Prior to Visit  Medication Sig Dispense Refill  . Cholecalciferol (VITAMIN D3) 2000 UNITS TABS Take 2 capsules by mouth. 2 times a week    . DiphenhydrAMINE HCl (BENADRYL ALLERGY PO) Take 1 capsule by mouth as needed.    . fexofenadine (ALLEGRA) 180 MG tablet Take 180 mg by mouth daily.    . Flaxseed, Linseed, (FLAXSEED OIL PO) Take 1 capsule by mouth 2 (two) times daily.    . fluticasone (FLONASE) 50 MCG/ACT nasal spray PLACE 2 SPRAYS INTO BOTH NOSTRILS DAILY. 16 g 4  .  Folate-B12-Intrinsic Factor (INTRINSI B12-FOLATE) [id number] MCG-MCG-MG TABS Take 1 capsule by mouth daily.    Marland Kitchen ibuprofen (ADVIL,MOTRIN) 400 MG tablet Take 400 mg by mouth as needed.    Marland Kitchen levothyroxine (SYNTHROID, LEVOTHROID) 112 MCG tablet TAKE 1 TABLET BY MOUTH EVERY MORNING BEFORE BREAKFAST 30 tablet 5  . MAGNESIUM GLYCINATE PLUS PO Take 1 tablet by mouth daily.    Marland Kitchen NIACINAMIDE-ZINC-COPPER-FA PO Take 1 capsule by mouth daily.    . norethindrone (MICRONOR,CAMILA,ERRIN) 0.35 MG tablet TAKE 1 TABLET (0.35 MG TOTAL) BY MOUTH DAILY. 28 tablet 7   No current facility-administered medications on file prior to visit.    Allergies  Allergen Reactions  . Mold Extract [Trichophyton]   . Pollen Extract      Review of Systems A comprehensive review of systems was negative except for: Genitourinary: positive for abnormal menstrual periods Behavioral/Psych: positive for mood swings, decreased libido.  Objective:   Blood pressure 163/71, pulse 94, height 5\' 2"  (1.575 m), weight 130 lb 3.2 oz (59.058 kg), last menstrual period 05/26/2015. Exam deferred.  Assessment:   Contraception management.   Plan:   Patient currently on mini-pill (has been  on x 1 year), notes cycles q 2-3 weeks.  Has used NuvaRing and OCPs in the past which caused moderate to severe nausea symptoms (even with use of Zofran).  In depth discussion had with patient where I reviewed all forms of birth control options available including abstinence; over the counter/barrier methods; hormonal contraceptive medication including pill, patch, ring, injection,contraceptive implant; hormonal and nonhormonal IUDs; permanent sterilization options including vasectomy and the various tubal sterilization modalities. Risks and benefits reviewed.  All questions were answered.  Information was given to patient to review.  Patient desires to try 1 month hormone free (will use barrier method and family planning) to allow her hormones to regulate.   Deciding between IUD (Paraguard vs Mirena) or Nexplanon.  RTC in 1 month.     A total of 45 minutes were spent face-to-face with the patient during the encounter and over half of that time involved counseling and coordination of care.   Rubie Maid, MD [facility]

## 2015-06-02 ENCOUNTER — Ambulatory Visit: Payer: BLUE CROSS/BLUE SHIELD

## 2015-06-10 ENCOUNTER — Ambulatory Visit (INDEPENDENT_AMBULATORY_CARE_PROVIDER_SITE_OTHER): Payer: BLUE CROSS/BLUE SHIELD | Admitting: *Deleted

## 2015-06-10 DIAGNOSIS — M779 Enthesopathy, unspecified: Secondary | ICD-10-CM

## 2015-06-10 NOTE — Progress Notes (Signed)
Patient presents to pick up 2nd pair of orthotics.

## 2015-06-19 ENCOUNTER — Other Ambulatory Visit: Payer: Self-pay | Admitting: Internal Medicine

## 2015-09-19 ENCOUNTER — Encounter: Payer: Self-pay | Admitting: Internal Medicine

## 2015-09-19 ENCOUNTER — Ambulatory Visit (INDEPENDENT_AMBULATORY_CARE_PROVIDER_SITE_OTHER): Payer: BLUE CROSS/BLUE SHIELD | Admitting: Internal Medicine

## 2015-09-19 VITALS — BP 144/86 | HR 82 | Temp 99.0°F | Wt 132.5 lb

## 2015-09-19 DIAGNOSIS — R21 Rash and other nonspecific skin eruption: Secondary | ICD-10-CM

## 2015-09-19 DIAGNOSIS — R0789 Other chest pain: Secondary | ICD-10-CM

## 2015-09-19 MED ORDER — HYDROCORTISONE 2.5 % EX CREA: TOPICAL_CREAM | Freq: Two times a day (BID) | CUTANEOUS | Status: DC

## 2015-09-19 NOTE — Progress Notes (Signed)
Subjective:    Patient ID: Rachael Holloway, female    DOB: 06/20/1975, 40 y.o.   MRN: [id number]  HPI  Pt presents to the clinic today with c/o a rash on her left elbow. This started 3 weeks ago. It started out as a single red bump, then spread to multiple red bumps in a circular pattern. The rash has been very itch. She put old Hydrocortisone cream on it with some relief. It has improved slightly. She denies working outside in the yard or know bug bite.  She also c/o chest discomfort. She reports this originally started 12 years ago. It is intermittent and not severe. It is located above the left breast. She describes the pain as a "twinge". It only last for about 10 seconds and then resolves without intervention. She denies chest pain, chest tightness or shortness of breath. She reports she had a full workup 12 years ago while she was in [place], everything was normal. She has not taken anything OTC for it.  Review of Systems      Past Medical History  Diagnosis Date  . Allergy   . Depression   . Thyroid disease   . Migraines   . Chicken pox   . White coat hypertension     Current Outpatient Prescriptions  Medication Sig Dispense Refill  . Cholecalciferol (VITAMIN D3) 2000 UNITS TABS Take 2 capsules by mouth. 2 times a week    . DiphenhydrAMINE HCl (BENADRYL ALLERGY PO) Take 1 capsule by mouth as needed.    . fexofenadine (ALLEGRA) 180 MG tablet Take 180 mg by mouth daily.    . Flaxseed, Linseed, (FLAXSEED OIL PO) Take 1 capsule by mouth 2 (two) times daily.    . fluticasone (FLONASE) 50 MCG/ACT nasal spray PLACE 2 SPRAYS INTO BOTH NOSTRILS DAILY. 16 g 4  . Folate-B12-Intrinsic Factor (INTRINSI B12-FOLATE) [id number] MCG-MCG-MG TABS Take 1 capsule by mouth daily.    Marland Kitchen ibuprofen (ADVIL,MOTRIN) 400 MG tablet Take 400 mg by mouth as needed.    Marland Kitchen levothyroxine (SYNTHROID, LEVOTHROID) 112 MCG tablet Take 112 mcg by mouth daily before breakfast.   4  . MAGNESIUM GLYCINATE PLUS PO Take  1 tablet by mouth daily.    Marland Kitchen NIACINAMIDE-ZINC-COPPER-FA PO Take 1 capsule by mouth daily.    . hydrocortisone 2.5 % cream Apply topically 2 (two) times daily. 30 g 0   No current facility-administered medications for this visit.    Allergies  Allergen Reactions  . Mold Extract [Trichophyton]   . Pollen Extract     Family History  Problem Relation Age of Onset  . Heart disease Father   . Hypertension Father   . Stroke Maternal Grandmother   . Stroke Paternal Grandfather   . Cancer Neg Hx   . Diabetes Neg Hx     Social History   Social History  . Marital Status: Married    Spouse Name: N/A  . Number of Children: N/A  . Years of Education: N/A   Occupational History  . Not on file.   Social History Main Topics  . Smoking status: Never Smoker   . Smokeless tobacco: Never Used  . Alcohol Use: 3.0 oz/week    5 Glasses of wine per week     Comment: moderate  . Drug Use: No  . Sexual Activity: Yes   Other Topics Concern  . Not on file   Social History Narrative     Constitutional: Denies fever, malaise, fatigue, headache or abrupt  weight changes.  Respiratory: Denies difficulty breathing, shortness of breath, cough or sputum production.   Cardiovascular: Pt reports chest discomfort. Denies chest tightness, palpitations or swelling in the hands or feet.  Gastrointestinal: Denies abdominal pain, bloating, constipation, diarrhea or blood in the stool.  GU: Denies urgency, frequency, pain with urination, burning sensation, blood in urine, odor or discharge. Skin: Pt reports rash. Denies ulcercations.    No other specific complaints in a complete review of systems (except as listed in HPI above).  Objective:   Physical Exam  BP 144/86 mmHg  Pulse 82  Temp(Src) 99 F (37.2 C) (Oral)  Wt 132 lb 8 oz (60.102 kg)  SpO2 97% Wt Readings from Last 3 Encounters:  09/19/15 132 lb 8 oz (60.102 kg)  05/26/15 130 lb 3.2 oz (59.058 kg)  04/22/15 130 lb (58.968 kg)     General: Appears her stated age, well developed, well nourished in NAD. Skin: Warm, dry and intact. 2 cm round rash noted on left elbow. Center is scaly, border is pustular. No warmth noted. Cardiovascular: Normal rate and rhythm. S1,S2 noted.  No murmur, rubs or gallops noted.  Pulmonary/Chest: Normal effort and positive vesicular breath sounds. No respiratory distress. No wheezes, rales or ronchi noted.  Abdomen: Soft and nontender.  Musculoskeletal: Chest pain not reproduced with palpation.   BMET    Component Value Date/Time   NA 137 10/07/2014 1052   K 4.0 10/07/2014 1052   CL 104 10/07/2014 1052   CO2 29 10/07/2014 1052   GLUCOSE 95 10/07/2014 1052   BUN 8 10/07/2014 1052   CREATININE 0.78 10/07/2014 1052   CALCIUM 9.1 10/07/2014 1052    Lipid Panel     Component Value Date/Time   CHOL 179 10/07/2014 1052   TRIG 132.0 10/07/2014 1052   HDL 53.50 10/07/2014 1052   CHOLHDL 3 10/07/2014 1052   VLDL 26.4 10/07/2014 1052   LDLCALC 99 10/07/2014 1052    CBC    Component Value Date/Time   WBC 8.2 10/07/2014 1052   RBC 4.69 10/07/2014 1052   HGB 14.7 10/07/2014 1052   HCT 42.7 10/07/2014 1052   PLT 286.0 10/07/2014 1052   MCV 91.0 10/07/2014 1052   MCHC 34.5 10/07/2014 1052   RDW 12.8 10/07/2014 1052    Hgb A1C No results found for: HGBA1C      Assessment & Plan:   Rash:  eRx for Hydrocortisone cream 2.5% twice daily Return precautions given  Chest discomfort:  Not severe, and not worsening She is not interested in further workup at this time Will continue to monitor  RTC as needed or if symptoms persist or worsen

## 2015-09-19 NOTE — Progress Notes (Signed)
Pre visit review using our clinic review tool, if applicable. No additional management support is needed unless otherwise documented below in the visit note. 

## 2015-09-19 NOTE — Patient Instructions (Signed)

## 2015-09-21 ENCOUNTER — Other Ambulatory Visit: Payer: Self-pay

## 2015-09-21 MED ORDER — LEVOTHYROXINE SODIUM 112 MCG PO TABS: 112.0000 ug | ORAL_TABLET | Freq: Every day | ORAL | Status: DC

## 2015-09-21 NOTE — Telephone Encounter (Signed)
Received fax requesting 90 day supply--will send Levothyroxine for 90 day supply and d/c previous Rx and refills

## 2015-09-28 ENCOUNTER — Other Ambulatory Visit: Payer: Self-pay | Admitting: Internal Medicine

## 2015-10-07 ENCOUNTER — Ambulatory Visit (INDEPENDENT_AMBULATORY_CARE_PROVIDER_SITE_OTHER): Payer: BLUE CROSS/BLUE SHIELD | Admitting: Internal Medicine

## 2015-10-07 ENCOUNTER — Encounter: Payer: Self-pay | Admitting: Internal Medicine

## 2015-10-07 VITALS — BP 128/78 | HR 77 | Temp 98.9°F | Wt 134.5 lb

## 2015-10-07 DIAGNOSIS — L309 Dermatitis, unspecified: Secondary | ICD-10-CM

## 2015-10-07 MED ORDER — PREDNISONE 10 MG PO TABS: ORAL_TABLET | ORAL | Status: DC

## 2015-10-07 NOTE — Progress Notes (Signed)
Pre visit review using our clinic review tool, if applicable. No additional management support is needed unless otherwise documented below in the visit note. 

## 2015-10-07 NOTE — Patient Instructions (Signed)
Eczema Eczema, also called atopic dermatitis, is a skin disorder that causes inflammation of the skin. It causes a red rash and dry, scaly skin. The skin becomes very itchy. Eczema is generally worse during the cooler winter months and often improves with the warmth of summer. Eczema usually starts showing signs in infancy. Some children outgrow eczema, but it may last through adulthood.  CAUSES  The exact cause of eczema is not known, but it appears to run in families. People with eczema often have a family history of eczema, allergies, asthma, or hay fever. Eczema is not contagious. Flare-ups of the condition may be caused by:   Contact with something you are sensitive or allergic to.   Stress. SIGNS AND SYMPTOMS  Dry, scaly skin.   Red, itchy rash.   Itchiness. This may occur before the skin rash and may be very intense.  DIAGNOSIS  The diagnosis of eczema is usually made based on symptoms and medical history. TREATMENT  Eczema cannot be cured, but symptoms usually can be controlled with treatment and other strategies. A treatment plan might include:  Controlling the itching and scratching.   Use over-the-counter antihistamines as directed for itching. This is especially useful at night when the itching tends to be worse.   Use over-the-counter steroid creams as directed for itching.   Avoid scratching. Scratching makes the rash and itching worse. It may also result in a skin infection (impetigo) due to a break in the skin caused by scratching.   Keeping the skin well moisturized with creams every day. This will seal in moisture and help prevent dryness. Lotions that contain alcohol and water should be avoided because they can dry the skin.   Limiting exposure to things that you are sensitive or allergic to (allergens).   Recognizing situations that cause stress.   Developing a plan to manage stress.  HOME CARE INSTRUCTIONS   Only take over-the-counter or  prescription medicines as directed by your health care provider.   Do not use anything on the skin without checking with your health care provider.   Keep baths or showers short (5 minutes) in warm (not hot) water. Use mild cleansers for bathing. These should be unscented. You may add nonperfumed bath oil to the bath water. It is best to avoid soap and bubble bath.   Immediately after a bath or shower, when the skin is still damp, apply a moisturizing ointment to the entire body. This ointment should be a petroleum ointment. This will seal in moisture and help prevent dryness. The thicker the ointment, the better. These should be unscented.   Keep fingernails cut short. Children with eczema may need to wear soft gloves or mittens at night after applying an ointment.   Dress in clothes made of cotton or cotton blends. Dress lightly, because heat increases itching.   A child with eczema should stay away from anyone with fever blisters or cold sores. The virus that causes fever blisters (herpes simplex) can cause a serious skin infection in children with eczema. SEEK MEDICAL CARE IF:   Your itching interferes with sleep.   Your rash gets worse or is not better within 1 week after starting treatment.   You see pus or soft yellow scabs in the rash area.   You have a fever.   You have a rash flare-up after contact with someone who has fever blisters.    This information is not intended to replace advice given to you by your health care   provider. Make sure you discuss any questions you have with your health care provider.   Document Released: 05/04/2000 Document Revised: 02/25/2013 Document Reviewed: 12/08/2012 Elsevier Interactive Patient Education 2016 [employer]  

## 2015-10-07 NOTE — Progress Notes (Signed)
Subjective:    Patient ID: Rachael Holloway, female    DOB: 12-27-1975, 40 y.o.   MRN: [id number]  HPI  Pt presents to the clinic today to follow up rash. She was seen 5/1/for the same. Given Hydrocortisone cream 2.5%. She reports the rash has spread to the inside of her elbows, behind her knees and under her armpits. The rash is still very itchy. She has continue to use the Hydrocortisone cream and reports it does seem better. She denies changes in soaps, lotions and detergents.  Review of Systems      Past Medical History  Diagnosis Date  . Allergy   . Depression   . Thyroid disease   . Migraines   . Chicken pox   . White coat hypertension     Current Outpatient Prescriptions  Medication Sig Dispense Refill  . Cholecalciferol (VITAMIN D3) 2000 UNITS TABS Take 2 capsules by mouth. 2 times a week    . DiphenhydrAMINE HCl (BENADRYL ALLERGY PO) Take 1 capsule by mouth as needed.    . fexofenadine (ALLEGRA) 180 MG tablet Take 180 mg by mouth daily.    . Flaxseed, Linseed, (FLAXSEED OIL PO) Take 1 capsule by mouth 2 (two) times daily.    . fluticasone (FLONASE) 50 MCG/ACT nasal spray PLACE 2 SPRAYS INTO BOTH NOSTRILS DAILY. 16 g 5  . Folate-B12-Intrinsic Factor (INTRINSI B12-FOLATE) [id number] MCG-MCG-MG TABS Take 1 capsule by mouth daily.    . hydrocortisone 2.5 % cream Apply topically 2 (two) times daily. 30 g 0  . ibuprofen (ADVIL,MOTRIN) 400 MG tablet Take 400 mg by mouth as needed.    Marland Kitchen levothyroxine (SYNTHROID, LEVOTHROID) 112 MCG tablet Take 1 tablet (112 mcg total) by mouth daily before breakfast. 90 tablet 1  . MAGNESIUM GLYCINATE PLUS PO Take 1 tablet by mouth daily.    Marland Kitchen NIACINAMIDE-ZINC-COPPER-FA PO Take 1 capsule by mouth daily.    . predniSONE (DELTASONE) 10 MG tablet Take 6 tabs day 1, 5 tabs day 2, 4 tabs day 3, 3 tabs day 4, 2 tabs day 5, 1 tab day 6 21 tablet 0   No current facility-administered medications for this visit.    Allergies  Allergen Reactions  .  Mold Extract [Trichophyton]   . Pollen Extract     Family History  Problem Relation Age of Onset  . Heart disease Father   . Hypertension Father   . Stroke Maternal Grandmother   . Stroke Paternal Grandfather   . Cancer Neg Hx   . Diabetes Neg Hx     Social History   Social History  . Marital Status: Married    Spouse Name: N/A  . Number of Children: N/A  . Years of Education: N/A   Occupational History  . Not on file.   Social History Main Topics  . Smoking status: Never Smoker   . Smokeless tobacco: Never Used  . Alcohol Use: 3.0 oz/week    5 Glasses of wine per week     Comment: moderate  . Drug Use: No  . Sexual Activity: Yes   Other Topics Concern  . Not on file   Social History Narrative     Constitutional: Denies fever, malaise, fatigue, headache or abrupt weight changes.  Respiratory: Denies difficulty breathing, shortness of breath, cough or sputum production.   Cardiovascular: Denies chest pain, chest tightness, palpitations or swelling in the hands or feet.  Skin: Pt reports rash. Denies redness, lesions or ulcercations.    No other  specific complaints in a complete review of systems (except as listed in HPI above).  Objective:   Physical Exam  BP 128/78 mmHg  Pulse 77  Temp(Src) 98.9 F (37.2 C) (Oral)  Wt 134 lb 8 oz (61.009 kg)  SpO2 98% Wt Readings from Last 3 Encounters:  10/07/15 134 lb 8 oz (61.009 kg)  09/19/15 132 lb 8 oz (60.102 kg)  05/26/15 130 lb 3.2 oz (59.058 kg)    General: Appears her stated age, well developed, well nourished in NAD. Skin: Scattered areas of scaly convalescent rash noted on bilateral elbows, antecubital fossa, bilateral armpits and bilateral knees, c/w eczema.   BMET    Component Value Date/Time   NA 137 10/07/2014 1052   K 4.0 10/07/2014 1052   CL 104 10/07/2014 1052   CO2 29 10/07/2014 1052   GLUCOSE 95 10/07/2014 1052   BUN 8 10/07/2014 1052   CREATININE 0.78 10/07/2014 1052   CALCIUM 9.1  10/07/2014 1052    Lipid Panel     Component Value Date/Time   CHOL 179 10/07/2014 1052   TRIG 132.0 10/07/2014 1052   HDL 53.50 10/07/2014 1052   CHOLHDL 3 10/07/2014 1052   VLDL 26.4 10/07/2014 1052   LDLCALC 99 10/07/2014 1052    CBC    Component Value Date/Time   WBC 8.2 10/07/2014 1052   RBC 4.69 10/07/2014 1052   HGB 14.7 10/07/2014 1052   HCT 42.7 10/07/2014 1052   PLT 286.0 10/07/2014 1052   MCV 91.0 10/07/2014 1052   MCHC 34.5 10/07/2014 1052   RDW 12.8 10/07/2014 1052    Hgb A1C No results found for: HGBA1C       Assessment & Plan:   Eczema:  Apply a moisturizing lotion like Aveeno BID Avoid hot showers or baths eRx for Pred Taper Can continue Hydrocortisone cream for smaller outbreaks If persist or worsens, will send to dermatology  RTC as needed or if symptoms persist or worsen

## 2015-12-05 ENCOUNTER — Other Ambulatory Visit: Payer: Self-pay | Admitting: Internal Medicine

## 2016-02-10 ENCOUNTER — Encounter: Payer: Self-pay | Admitting: Internal Medicine

## 2016-02-10 ENCOUNTER — Ambulatory Visit (INDEPENDENT_AMBULATORY_CARE_PROVIDER_SITE_OTHER): Payer: BLUE CROSS/BLUE SHIELD | Admitting: Internal Medicine

## 2016-02-10 VITALS — BP 102/78 | HR 96 | Temp 98.6°F | Ht 62.0 in | Wt 131.5 lb

## 2016-02-10 DIAGNOSIS — G43019 Migraine without aura, intractable, without status migrainosus: Secondary | ICD-10-CM | POA: Diagnosis not present

## 2016-02-10 DIAGNOSIS — Z1239 Encounter for other screening for malignant neoplasm of breast: Secondary | ICD-10-CM

## 2016-02-10 DIAGNOSIS — F32A Depression, unspecified: Secondary | ICD-10-CM

## 2016-02-10 DIAGNOSIS — E039 Hypothyroidism, unspecified: Secondary | ICD-10-CM

## 2016-02-10 DIAGNOSIS — Z0000 Encounter for general adult medical examination without abnormal findings: Secondary | ICD-10-CM

## 2016-02-10 DIAGNOSIS — F329 Major depressive disorder, single episode, unspecified: Secondary | ICD-10-CM

## 2016-02-10 NOTE — Progress Notes (Signed)
Pre visit review using our clinic review tool, if applicable. No additional management support is needed unless otherwise documented below in the visit note. 

## 2016-02-10 NOTE — Progress Notes (Signed)
Subjective:    Patient ID: Rachael Holloway, female    DOB: January 06, 1976, 40 y.o.   MRN: [id number]  HPI  Pt presents to the clinic today for her annual exam. She is also due to follow up chronic conditions.  Depression: Currently not medicatied for this. She denies SI/HI.  Hypothyroidism: Has noticed some weight gain but otherwise stable on current dose of Synthroid. Labs from 03/2015 reviewed.  Migraines: Sinus related. Worse when the weather changes. Takes Allegra and Flonase daily which helps.  Flu: 01/2016 Tetanus: 2011 Pap Smear: 08/2013 Mammogram: 03/2014 Vision Screening: annually Dentist: biannually  Diet: She does eat meat. She consumes fruits and veggies daily. She tries to avoid fried foods. She drinks mostly water, coffee and tea. Exercise: She does cardio 4 x weeks , tai chi 2 x week.  Review of Systems  Past Medical History:  Diagnosis Date  . Allergy   . Chicken pox   . Depression   . Migraines   . Thyroid disease   . White coat hypertension     Current Outpatient Prescriptions  Medication Sig Dispense Refill  . Cholecalciferol (VITAMIN D3) 2000 UNITS TABS Take 2 capsules by mouth. 2 times a week    . DiphenhydrAMINE HCl (BENADRYL ALLERGY PO) Take 1 capsule by mouth as needed.    . fexofenadine (ALLEGRA) 180 MG tablet Take 180 mg by mouth daily.    . Flaxseed, Linseed, (FLAXSEED OIL PO) Take 1 capsule by mouth 2 (two) times daily.    . fluticasone (FLONASE) 50 MCG/ACT nasal spray PLACE 2 SPRAYS INTO BOTH NOSTRILS DAILY. 16 g 5  . Folate-B12-Intrinsic Factor (INTRINSI B12-FOLATE) [id number] MCG-MCG-MG TABS Take 1 capsule by mouth daily.    . hydrocortisone 2.5 % cream Apply topically 2 (two) times daily. 30 g 0  . ibuprofen (ADVIL,MOTRIN) 400 MG tablet Take 400 mg by mouth as needed.    Marland Kitchen levothyroxine (SYNTHROID, LEVOTHROID) 112 MCG tablet TAKE 1 TABLET BY MOUTH EVERY MORNING BEFORE BREAKFAST 30 tablet 2  . MAGNESIUM GLYCINATE PLUS PO Take 1 tablet by  mouth daily.    Marland Kitchen NIACINAMIDE-ZINC-COPPER-FA PO Take 1 capsule by mouth daily.    . predniSONE (DELTASONE) 10 MG tablet Take 6 tabs day 1, 5 tabs day 2, 4 tabs day 3, 3 tabs day 4, 2 tabs day 5, 1 tab day 6 21 tablet 0   No current facility-administered medications for this visit.     Allergies  Allergen Reactions  . Mold Extract [Trichophyton]   . Pollen Extract     Family History  Problem Relation Age of Onset  . Heart disease Father   . Hypertension Father   . Stroke Maternal Grandmother   . Stroke Paternal Grandfather   . Cancer Neg Hx   . Diabetes Neg Hx     Social History   Social History  . Marital status: Married    Spouse name: N/A  . Number of children: N/A  . Years of education: N/A   Occupational History  . Not on file.   Social History Main Topics  . Smoking status: Never Smoker  . Smokeless tobacco: Never Used  . Alcohol use 3.0 oz/week    5 Glasses of wine per week     Comment: moderate  . Drug use: No  . Sexual activity: Yes   Other Topics Concern  . Not on file   Social History Narrative  . No narrative on file     Constitutional: Denies fever,  malaise, fatigue, headache or abrupt weight changes.  HEENT: Denies eye pain, eye redness, ear pain, ringing in the ears, wax buildup, runny nose, nasal congestion, bloody nose, or sore throat. Respiratory: Denies difficulty breathing, shortness of breath, cough or sputum production.   Cardiovascular: Denies chest pain, chest tightness, palpitations or swelling in the hands or feet.  Gastrointestinal: Denies abdominal pain, bloating, constipation, diarrhea or blood in the stool.  GU: Pt reports decreased libido. Denies urgency, frequency, pain with urination, burning sensation, blood in urine, odor or discharge. Musculoskeletal: Denies decrease in range of motion, difficulty with gait, muscle pain or joint pain and swelling.  Skin: Denies redness, rashes, lesions or ulcercations.  Neurological: Denies  dizziness, difficulty with memory, difficulty with speech or problems with balance and coordination.  Psych: Denies anxiety, depression, SI/HI.  No other specific complaints in a complete review of systems (except as listed in HPI above).     Objective:   Physical Exam  BP 102/78 (BP Location: Left Arm, Patient Position: Sitting)   Pulse 96   Temp 98.6 F (37 C) (Oral)   Ht _0  (1.575 m)   Wt 131 lb 8 oz (59.6 kg)   LMP 01/24/2016 (Approximate)   SpO2 98%   BMI 24.05 kg/m   Wt Readings from Last 3 Encounters:  10/07/15 134 lb 8 oz (61 kg)  09/19/15 132 lb 8 oz (60.1 kg)  05/26/15 130 lb 3.2 oz (59.1 kg)    General: Appears her stated age, well developed, well nourished in NAD. Skin: Warm, dry and intact.  HEENT: Head: normal shape and size; Eyes: sclera white, no icterus, conjunctiva pink, PERRLA and EOMs intact; Ears: Tm's gray and intact, normal light reflex; Throat/Mouth: Teeth present, mucosa pink and moist, no exudate, lesions or ulcerations noted.  Neck: Neck supple, trachea midline. No masses, lumps or thyromegaly present.  Cardiovascular: Normal rate and rhythm. S1,S2 noted.  No murmur, rubs or gallops noted. No JVD or BLE edema.  Pulmonary/Chest: Normal effort and positive vesicular breath sounds. No respiratory distress. No wheezes, rales or ronchi noted.  Abdomen: Soft and nontender. Normal bowel sounds. No distention or masses noted. Liver, spleen and kidneys non palpable. Musculoskeletal: Strength 5/5 BUE/BLE. No signs of joint swelling. No difficulty with gait.  Neurological: Alert and oriented. Cranial nerves II-XII grossly intact. Coordination normal.  Psychiatric: Mood and affect normal. Behavior is normal. Judgment and thought content normal.     BMET    Component Value Date/Time   NA 137 10/07/2014 1052   K 4.0 10/07/2014 1052   CL 104 10/07/2014 1052   CO2 29 10/07/2014 1052   GLUCOSE 95 10/07/2014 1052   BUN 8 10/07/2014 1052   CREATININE 0.78  10/07/2014 1052   CALCIUM 9.1 10/07/2014 1052    Lipid Panel     Component Value Date/Time   CHOL 179 10/07/2014 1052   TRIG 132.0 10/07/2014 1052   HDL 53.50 10/07/2014 1052   CHOLHDL 3 10/07/2014 1052   VLDL 26.4 10/07/2014 1052   LDLCALC 99 10/07/2014 1052    CBC    Component Value Date/Time   WBC 8.2 10/07/2014 1052   RBC 4.69 10/07/2014 1052   HGB 14.7 10/07/2014 1052   HCT 42.7 10/07/2014 1052   PLT 286.0 10/07/2014 1052   MCV 91.0 10/07/2014 1052   MCHC 34.5 10/07/2014 1052   RDW 12.8 10/07/2014 1052    Hgb A1C No results found for: HGBA1C      Assessment & Plan:  Preventative Health Maintenance:  Flu shot UTD Tetanus UTD Mammogram ordered- she will call to schedule Pap Smear due 2018 Encouraged her to consume a balanced diet and exercise regimen Advised her to see an eye doctor and dentist annually Will check CBC, CMET, Lipid, TSH and T4 today- at [facility]  RTC in 1 year for annual exam/follow up Webb Silversmith, NP

## 2016-02-10 NOTE — Assessment & Plan Note (Signed)
Controlled on Allegra and Flonase

## 2016-02-10 NOTE — Assessment & Plan Note (Signed)
TSH and T4 today Continue current dose of Synthroid

## 2016-02-10 NOTE — Assessment & Plan Note (Signed)
Stable off meds °Will monitor °

## 2016-02-10 NOTE — Patient Instructions (Signed)
Health Maintenance, Female Adopting a healthy lifestyle and getting preventive care can go a long way to promote health and wellness. Talk with your health care provider about what schedule of regular examinations is right for you. This is a good chance for you to check in with your provider about disease prevention and staying healthy. In between checkups, there are plenty of things you can do on your own. Experts have done a lot of research about which lifestyle changes and preventive measures are most likely to keep you healthy. Ask your health care provider for more information. WEIGHT AND DIET  Eat a healthy diet  Be sure to include plenty of vegetables, fruits, low-fat dairy products, and lean protein.  Do not eat a lot of foods high in solid fats, added sugars, or salt.  Get regular exercise. This is one of the most important things you can do for your health.  Most adults should exercise for at least 150 minutes each week. The exercise should increase your heart rate and make you sweat (moderate-intensity exercise).  Most adults should also do strengthening exercises at least twice a week. This is in addition to the moderate-intensity exercise.  Maintain a healthy weight  Body mass index (BMI) is a measurement that can be used to identify possible weight problems. It estimates body fat based on height and weight. Your health care provider can help determine your BMI and help you achieve or maintain a healthy weight.  For females 20 years of age and older:   A BMI below 18.5 is considered underweight.  A BMI of 18.5 to 24.9 is normal.  A BMI of 25 to 29.9 is considered overweight.  A BMI of 30 and above is considered obese.  Watch levels of cholesterol and blood lipids  You should start having your blood tested for lipids and cholesterol at 42 years of age, then have this test every 5 years.  You may need to have your cholesterol levels checked more often if:  Your lipid  or cholesterol levels are high.  You are older than 42 years of age.  You are at high risk for heart disease.  CANCER SCREENING   Lung Cancer  Lung cancer screening is recommended for adults 55-80 years old who are at high risk for lung cancer because of a history of smoking.  A yearly low-dose CT scan of the lungs is recommended for people who:  Currently smoke.  Have quit within the past 15 years.  Have at least a 30-pack-year history of smoking. A pack year is smoking an average of one pack of cigarettes a day for 1 year.  Yearly screening should continue until it has been 15 years since you quit.  Yearly screening should stop if you develop a health problem that would prevent you from having lung cancer treatment.  Breast Cancer  Practice breast self-awareness. This means understanding how your breasts normally appear and feel.  It also means doing regular breast self-exams. Let your health care provider know about any changes, no matter how small.  If you are in your 20s or 30s, you should have a clinical breast exam (CBE) by a health care provider every 1-3 years as part of a regular health exam.  If you are 40 or older, have a CBE every year. Also consider having a breast X-ray (mammogram) every year.  If you have a family history of breast cancer, talk to your health care provider about genetic screening.  If you   are at high risk for breast cancer, talk to your health care provider about having an MRI and a mammogram every year.  Breast cancer gene (BRCA) assessment is recommended for women who have family members with BRCA-related cancers. BRCA-related cancers include:  Breast.  Ovarian.  Tubal.  Peritoneal cancers.  Results of the assessment will determine the need for genetic counseling and BRCA1 and BRCA2 testing. Cervical Cancer Your health care provider may recommend that you be screened regularly for cancer of the pelvic organs (ovaries, uterus, and  vagina). This screening involves a pelvic examination, including checking for microscopic changes to the surface of your cervix (Pap test). You may be encouraged to have this screening done every 3 years, beginning at age 21.  For women ages 30-65, health care providers may recommend pelvic exams and Pap testing every 3 years, or they may recommend the Pap and pelvic exam, combined with testing for human papilloma virus (HPV), every 5 years. Some types of HPV increase your risk of cervical cancer. Testing for HPV may also be done on women of any age with unclear Pap test results.  Other health care providers may not recommend any screening for nonpregnant women who are considered low risk for pelvic cancer and who do not have symptoms. Ask your health care provider if a screening pelvic exam is right for you.  If you have had past treatment for cervical cancer or a condition that could lead to cancer, you need Pap tests and screening for cancer for at least 20 years after your treatment. If Pap tests have been discontinued, your risk factors (such as having a new sexual partner) need to be reassessed to determine if screening should resume. Some women have medical problems that increase the chance of getting cervical cancer. In these cases, your health care provider may recommend more frequent screening and Pap tests. Colorectal Cancer  This type of cancer can be detected and often prevented.  Routine colorectal cancer screening usually begins at 42 years of age and continues through 42 years of age.  Your health care provider may recommend screening at an earlier age if you have risk factors for colon cancer.  Your health care provider may also recommend using home test kits to check for hidden blood in the stool.  A small camera at the end of a tube can be used to examine your colon directly (sigmoidoscopy or colonoscopy). This is done to check for the earliest forms of colorectal  cancer.  Routine screening usually begins at age 50.  Direct examination of the colon should be repeated every 5-10 years through 42 years of age. However, you may need to be screened more often if early forms of precancerous polyps or small growths are found. Skin Cancer  Check your skin from head to toe regularly.  Tell your health care provider about any new moles or changes in moles, especially if there is a change in a mole's shape or color.  Also tell your health care provider if you have a mole that is larger than the size of a pencil eraser.  Always use sunscreen. Apply sunscreen liberally and repeatedly throughout the day.  Protect yourself by wearing long sleeves, pants, a wide-brimmed hat, and sunglasses whenever you are outside. HEART DISEASE, DIABETES, AND HIGH BLOOD PRESSURE   High blood pressure causes heart disease and increases the risk of stroke. High blood pressure is more likely to develop in:  People who have blood pressure in the high end   of the normal range (130-139/85-89 mm Hg).  People who are overweight or obese.  People who are African American.  If you are 38-23 years of age, have your blood pressure checked every 3-5 years. If you are 61 years of age or older, have your blood pressure checked every year. You should have your blood pressure measured twice--once when you are at a hospital or clinic, and once when you are not at a hospital or clinic. Record the average of the two measurements. To check your blood pressure when you are not at a hospital or clinic, you can use:  An automated blood pressure machine at a pharmacy.  A home blood pressure monitor.  If you are between 45 years and 39 years old, ask your health care provider if you should take aspirin to prevent strokes.  Have regular diabetes screenings. This involves taking a blood sample to check your fasting blood sugar level.  If you are at a normal weight and have a low risk for diabetes,  have this test once every three years after 42 years of age.  If you are overweight and have a high risk for diabetes, consider being tested at a younger age or more often. PREVENTING INFECTION  Hepatitis B  If you have a higher risk for hepatitis B, you should be screened for this virus. You are considered at high risk for hepatitis B if:  You were born in a country where hepatitis B is common. Ask your health care provider which countries are considered high risk.  Your parents were born in a high-risk country, and you have not been immunized against hepatitis B (hepatitis B vaccine).  You have HIV or AIDS.  You use needles to inject street drugs.  You live with someone who has hepatitis B.  You have had sex with someone who has hepatitis B.  You get hemodialysis treatment.  You take certain medicines for conditions, including cancer, organ transplantation, and autoimmune conditions. Hepatitis C  Blood testing is recommended for:  Everyone born from [birth year] through 1967.  Anyone with known risk factors for hepatitis C. Sexually transmitted infections (STIs)  You should be screened for sexually transmitted infections (STIs) including gonorrhea and chlamydia if:  You are sexually active and are younger than 42 years of age.  You are older than 42 years of age and your health care provider tells you that you are at risk for this type of infection.  Your sexual activity has changed since you were last screened and you are at an increased risk for chlamydia or gonorrhea. Ask your health care provider if you are at risk.  If you do not have HIV, but are at risk, it may be recommended that you take a prescription medicine daily to prevent HIV infection. This is called pre-exposure prophylaxis (PrEP). You are considered at risk if:  You are sexually active and do not regularly use condoms or know the HIV status of your partner(s).  You take drugs by injection.  You are sexually  active with a partner who has HIV. Talk with your health care provider about whether you are at high risk of being infected with HIV. If you choose to begin PrEP, you should first be tested for HIV. You should then be tested every 3 months for as long as you are taking PrEP.  PREGNANCY   If you are premenopausal and you may become pregnant, ask your health care provider about preconception counseling.  If you may  become pregnant, take 400 to 800 micrograms (mcg) of folic acid every day.  If you want to prevent pregnancy, talk to your health care provider about birth control (contraception). OSTEOPOROSIS AND MENOPAUSE   Osteoporosis is a disease in which the bones lose minerals and strength with aging. This can result in serious bone fractures. Your risk for osteoporosis can be identified using a bone density scan.  If you are 61 years of age or older, or if you are at risk for osteoporosis and fractures, ask your health care provider if you should be screened.  Ask your health care provider whether you should take a calcium or vitamin D supplement to lower your risk for osteoporosis.  Menopause may have certain physical symptoms and risks.  Hormone replacement therapy may reduce some of these symptoms and risks. Talk to your health care provider about whether hormone replacement therapy is right for you.  HOME CARE INSTRUCTIONS   Schedule regular health, dental, and eye exams.  Stay current with your immunizations.   Do not use any tobacco products including cigarettes, chewing tobacco, or electronic cigarettes.  If you are pregnant, do not drink alcohol.  If you are breastfeeding, limit how much and how often you drink alcohol.  Limit alcohol intake to no more than 1 drink per day for nonpregnant women. One drink equals 12 ounces of beer, 5 ounces of wine, or 1 ounces of hard liquor.  Do not use street drugs.  Do not share needles.  Ask your health care provider for help if  you need support or information about quitting drugs.  Tell your health care provider if you often feel depressed.  Tell your health care provider if you have ever been abused or do not feel safe at home.   This information is not intended to replace advice given to you by your health care provider. Make sure you discuss any questions you have with your health care provider.   Document Released: 11/20/2010 Document Revised: 05/28/2014 Document Reviewed: 04/08/2013 Elsevier Interactive Patient Education [employer].

## 2016-03-07 ENCOUNTER — Other Ambulatory Visit: Payer: Self-pay | Admitting: Internal Medicine

## 2016-03-16 ENCOUNTER — Ambulatory Visit
Admission: RE | Admit: 2016-03-16 | Discharge: 2016-03-16 | Disposition: A | Discharge status: Home / Self Care | Payer: BLUE CROSS/BLUE SHIELD | Source: Ambulatory Visit | Attending: Internal Medicine | Admitting: Internal Medicine

## 2016-03-16 DIAGNOSIS — Z1231 Encounter for screening mammogram for malignant neoplasm of breast: Secondary | ICD-10-CM | POA: Insufficient documentation

## 2016-03-16 DIAGNOSIS — Z1239 Encounter for other screening for malignant neoplasm of breast: Secondary | ICD-10-CM

## 2016-03-19 ENCOUNTER — Other Ambulatory Visit: Payer: Self-pay | Admitting: Internal Medicine

## 2016-03-19 DIAGNOSIS — N6320 Unspecified lump in the left breast, unspecified quadrant: Secondary | ICD-10-CM

## 2016-03-28 ENCOUNTER — Other Ambulatory Visit: Payer: Self-pay

## 2016-03-28 MED ORDER — FLUTICASONE PROPIONATE 50 MCG/ACT NA SUSP: NASAL | 1 refills | Status: DC

## 2016-04-05 ENCOUNTER — Other Ambulatory Visit: Payer: Self-pay | Admitting: Internal Medicine

## 2016-04-06 ENCOUNTER — Ambulatory Visit
Admission: RE | Admit: 2016-04-06 | Discharge: 2016-04-06 | Disposition: A | Discharge status: Home / Self Care | Payer: BLUE CROSS/BLUE SHIELD | Source: Ambulatory Visit | Attending: Internal Medicine | Admitting: Internal Medicine

## 2016-04-06 DIAGNOSIS — N6320 Unspecified lump in the left breast, unspecified quadrant: Secondary | ICD-10-CM

## 2016-04-06 DIAGNOSIS — N63 Unspecified lump in unspecified breast: Secondary | ICD-10-CM | POA: Diagnosis not present

## 2016-04-06 DIAGNOSIS — N6321 Unspecified lump in the left breast, upper outer quadrant: Secondary | ICD-10-CM | POA: Diagnosis not present

## 2016-04-19 NOTE — Telephone Encounter (Signed)
Letter mailed to remind pt that labs are due

## 2016-05-04 ENCOUNTER — Other Ambulatory Visit (INDEPENDENT_AMBULATORY_CARE_PROVIDER_SITE_OTHER): Payer: BLUE CROSS/BLUE SHIELD

## 2016-05-04 DIAGNOSIS — E039 Hypothyroidism, unspecified: Secondary | ICD-10-CM | POA: Diagnosis not present

## 2016-05-04 LAB — T4, FREE: FREE T4: 1.01 ng/dL (ref 0.60–1.60)

## 2016-05-04 LAB — TSH: TSH: 0.84 u[IU]/mL (ref 0.35–4.50)

## 2016-06-11 ENCOUNTER — Other Ambulatory Visit: Payer: Self-pay | Admitting: Internal Medicine

## 2016-06-17 ENCOUNTER — Telehealth: Payer: BLUE CROSS/BLUE SHIELD | Admitting: Physician Assistant

## 2016-06-17 DIAGNOSIS — R509 Fever, unspecified: Secondary | ICD-10-CM

## 2016-06-17 NOTE — Progress Notes (Signed)
Based on what you shared with me it looks like you have a serious condition that should be evaluated in a face to face office visit. If you have a cough and a fever > 101 for over two days then there is concern for flu versus bacterial bronchitis versus a developing pneumonia. Since it is over the 48 hour mark, we cannot treat a flu-like illness with Tamiflu. I recommend you seek care at an Urgent care for a good assessment and lung examination so the appropriate treatment can be given.    NOTE: Even if you have entered your credit card information for this eVisit, you will not be charged.   If you are having a true medical emergency please call 911.  If you need an urgent face to face visit, [facility] has four urgent care centers for your convenience.  If you need care fast and have a high deductible or no insurance consider:   [email or web address]  [phone]  [address], [place], [place] [address] 8 am to 8 pm Monday-Friday 10 am to 4 pm Saturday-Sunday   The following sites will take your  insurance:    . [facility] [place] a Provider at this Location  [address] [place], [place] [address] . 10 am to 8 pm Monday-Friday . 12 pm to 8 pm Saturday-Sunday   . [facility] at [place] a Provider at this Location  [address], [place], [place] [address] . 8 am to 8 pm Monday-Friday . 9 am to 6 pm Saturday . 11 am to 6 pm Sunday   . [facility] at [place] Get Driving Directions  [id number] [place], [place] [address] . 8 am to 8 pm Monday-Friday . 8 am to 4 pm Saturday-Sunday   Your e-visit answers were reviewed by a board certified advanced clinical practitioner to complete your personal care plan.  Thank you for using e-Visits.

## 2016-06-22 ENCOUNTER — Encounter: Payer: Self-pay | Admitting: Internal Medicine

## 2016-06-22 ENCOUNTER — Ambulatory Visit (INDEPENDENT_AMBULATORY_CARE_PROVIDER_SITE_OTHER): Payer: BLUE CROSS/BLUE SHIELD | Admitting: Internal Medicine

## 2016-06-22 VITALS — BP 126/88 | HR 88 | Temp 98.2°F | Wt 133.0 lb

## 2016-06-22 DIAGNOSIS — R05 Cough: Secondary | ICD-10-CM

## 2016-06-22 DIAGNOSIS — R058 Other specified cough: Secondary | ICD-10-CM

## 2016-06-22 MED ORDER — HYDROCODONE-HOMATROPINE 5-1.5 MG/5ML PO SYRP: 5.0000 mL | ORAL_SOLUTION | Freq: Three times a day (TID) | ORAL | 0 refills | Status: DC | PRN

## 2016-06-22 NOTE — Progress Notes (Signed)
Subjective:    Patient ID: Rachael Holloway, female    DOB: Jun 02, 1975, 41 y.o.   MRN: [id number]  HPI  Pt presents to the clinic today with c/o cough. She did an E Visit 06/17/16, with c/o flu like symptoms. They advised her to make an appt with her PCP or go to UC. She went to the [facility] the next day and tested positive for the flu. They treated her with Albuterol inhaler and Tessalon Pearls. She reports overall she does feel better, she just has this persistent, dry cough. She denies shortness of breath, unless she gets in a coughing fit. She has tried Mucinex, Sudafed, Tylenol, and Ibuprofen for the cough with minimal relief.  Review of Systems  Past Medical History:  Diagnosis Date  . Allergy   . Chicken pox   . Depression   . Migraines   . Thyroid disease   . White coat hypertension     Current Outpatient Prescriptions  Medication Sig Dispense Refill  . benzonatate (TESSALON) 100 MG capsule TAKE 1-2 CAPSULE BY MOUTH EVERY EIGHT HOURS AS NEEDED FOR COUGH  0  . Cholecalciferol (VITAMIN D3) 2000 UNITS TABS Take 2 capsules by mouth. 2 times a week    . DiphenhydrAMINE HCl (BENADRYL ALLERGY PO) Take 1 capsule by mouth as needed.    . fexofenadine (ALLEGRA) 180 MG tablet Take 180 mg by mouth daily.    . Flaxseed, Linseed, (FLAXSEED OIL PO) Take 1 capsule by mouth 2 (two) times daily.    . fluticasone (FLONASE) 50 MCG/ACT nasal spray PLACE 2 SPRAYS INTO BOTH NOSTRILS DAILY. 48 g 1  . Folate-B12-Intrinsic Factor (INTRINSI B12-FOLATE) [id number] MCG-MCG-MG TABS Take 1 capsule by mouth daily.    . hydrocortisone 2.5 % cream Apply topically 2 (two) times daily. 30 g 0  . ibuprofen (ADVIL,MOTRIN) 400 MG tablet Take 400 mg by mouth as needed.    Marland Kitchen levothyroxine (SYNTHROID, LEVOTHROID) 112 MCG tablet TAKE 1 TABLET BY MOUTH EVERY MORNING BEFORE BREAKFAST 30 tablet 6  . MAGNESIUM GLYCINATE PLUS PO Take 1 tablet by mouth daily.    Marland Kitchen NIACINAMIDE-ZINC-COPPER-FA PO Take 1 capsule by mouth  daily.    . VENTOLIN HFA 108 (90 Base) MCG/ACT inhaler USE 2 PUFFS EVERY 4 TO 6 HOURS AS NEEDED FOR WHEEZING, COUGH, OR SHORTNESS OF BREATH  1  . HYDROcodone-homatropine (HYCODAN) 5-1.5 MG/5ML syrup Take 5 mLs by mouth every 8 (eight) hours as needed for cough. 120 mL 0   No current facility-administered medications for this visit.     Allergies  Allergen Reactions  . Mold Extract [Trichophyton]   . Pollen Extract     Family History  Problem Relation Age of Onset  . Heart disease Father   . Hypertension Father   . Stroke Maternal Grandmother   . Stroke Paternal Grandfather   . Breast cancer Cousin   . Cancer Neg Hx   . Diabetes Neg Hx     Social History   Social History  . Marital status: Married    Spouse name: N/A  . Number of children: N/A  . Years of education: N/A   Occupational History  . Not on file.   Social History Main Topics  . Smoking status: Never Smoker  . Smokeless tobacco: Never Used  . Alcohol use 3.0 oz/week    5 Glasses of wine per week     Comment: moderate  . Drug use: No  . Sexual activity: Yes   Other Topics  Concern  . Not on file   Social History Narrative  . No narrative on file     Constitutional: Pt reports fatigue. Denies fever, headache or abrupt weight changes.  HEENT: Denies eye pain, eye redness, ear pain, ringing in the ears, wax buildup, runny nose, nasal congestion, bloody nose, or sore throat. Respiratory: Pt reports cough. Denies difficulty breathing, shortness of breath, or sputum production.    No other specific complaints in a complete review of systems (except as listed in HPI above).     Objective:   Physical Exam  BP 126/88   Pulse 88   Temp 98.2 F (36.8 C) (Oral)   Wt 133 lb (60.3 kg)   SpO2 99%   BMI 24.33 kg/m  Wt Readings from Last 3 Encounters:  06/22/16 133 lb (60.3 kg)  02/10/16 131 lb 8 oz (59.6 kg)  10/07/15 134 lb 8 oz (61 kg)    General: Appears her stated age, in NAD. HEENT:  hroat/Mouth: Teeth present, mucosa pink and moist, no exudate, lesions or ulcerations noted.  Neck:  No adenopathy noted.  Cardiovascular: Normal rate and rhythm.  Pulmonary/Chest: Normal effort and positive vesicular breath sounds. No respiratory distress. No wheezes, rales or ronchi noted.    BMET    Component Value Date/Time   NA 137 10/07/2014 1052   K 4.0 10/07/2014 1052   CL 104 10/07/2014 1052   CO2 29 10/07/2014 1052   GLUCOSE 95 10/07/2014 1052   BUN 8 10/07/2014 1052   CREATININE 0.78 10/07/2014 1052   CALCIUM 9.1 10/07/2014 1052    Lipid Panel     Component Value Date/Time   CHOL 179 10/07/2014 1052   TRIG 132.0 10/07/2014 1052   HDL 53.50 10/07/2014 1052   CHOLHDL 3 10/07/2014 1052   VLDL 26.4 10/07/2014 1052   LDLCALC 99 10/07/2014 1052    CBC    Component Value Date/Time   WBC 8.2 10/07/2014 1052   RBC 4.69 10/07/2014 1052   HGB 14.7 10/07/2014 1052   HCT 42.7 10/07/2014 1052   PLT 286.0 10/07/2014 1052   MCV 91.0 10/07/2014 1052   MCHC 34.5 10/07/2014 1052   RDW 12.8 10/07/2014 1052    Hgb A1C No results found for: HGBA1C          Assessment & Plan:   Post Viral Cough Syndrome:  OK to continue Albuterol and Tessalon Pearls RX for Hycodan for cough  RTC as needed or if symptoms persist or worsen Cynthya Yam, NP

## 2016-06-22 NOTE — Patient Instructions (Signed)
Cough, Adult Introduction A cough helps to clear your throat and lungs. A cough may last only 2-3 weeks (acute), or it may last longer than 8 weeks (chronic). Many different things can cause a cough. A cough may be a sign of an illness or another medical condition. Follow these instructions at home:  Pay attention to any changes in your cough.  Take medicines only as told by your doctor.  If you were prescribed an antibiotic medicine, take it as told by your doctor. Do not stop taking it even if you start to feel better.  Talk with your doctor before you try using a cough medicine.  Drink enough fluid to keep your pee (urine) clear or pale yellow.  If the air is dry, use a cold steam vaporizer or humidifier in your home.  Stay away from things that make you cough at work or at home.  If your cough is worse at night, try using extra pillows to raise your head up higher while you sleep.  Do not smoke, and try not to be around smoke. If you need help quitting, ask your doctor.  Do not have caffeine.  Do not drink alcohol.  Rest as needed. Contact a doctor if:  You have new problems (symptoms).  You cough up yellow fluid (pus).  Your cough does not get better after 2-3 weeks, or your cough gets worse.  Medicine does not help your cough and you are not sleeping well.  You have pain that gets worse or pain that is not helped with medicine.  You have a fever.  You are losing weight and you do not know why.  You have night sweats. Get help right away if:  You cough up blood.  You have trouble breathing.  Your heartbeat is very fast. This information is not intended to replace advice given to you by your health care provider. Make sure you discuss any questions you have with your health care provider. Document Released: 01/18/2011 Document Revised: 10/13/2015 Document Reviewed: 07/14/2014  2017 Elsevier  

## 2016-08-27 ENCOUNTER — Encounter: Payer: Self-pay | Admitting: Internal Medicine

## 2016-08-27 DIAGNOSIS — R928 Other abnormal and inconclusive findings on diagnostic imaging of breast: Secondary | ICD-10-CM

## 2016-10-08 ENCOUNTER — Ambulatory Visit
Admission: RE | Admit: 2016-10-08 | Discharge: 2016-10-08 | Disposition: A | Discharge status: Home / Self Care | Payer: BLUE CROSS/BLUE SHIELD | Source: Ambulatory Visit | Attending: Internal Medicine | Admitting: Internal Medicine

## 2016-10-08 DIAGNOSIS — N6002 Solitary cyst of left breast: Secondary | ICD-10-CM | POA: Diagnosis not present

## 2016-10-08 DIAGNOSIS — R928 Other abnormal and inconclusive findings on diagnostic imaging of breast: Secondary | ICD-10-CM | POA: Diagnosis not present

## 2017-01-08 ENCOUNTER — Other Ambulatory Visit: Payer: Self-pay | Admitting: Internal Medicine

## 2017-01-14 ENCOUNTER — Other Ambulatory Visit: Payer: Self-pay | Admitting: Internal Medicine

## 2017-01-23 ENCOUNTER — Encounter: Payer: Self-pay | Admitting: Internal Medicine

## 2017-02-27 ENCOUNTER — Encounter: Payer: Self-pay | Admitting: Internal Medicine

## 2017-03-09 ENCOUNTER — Other Ambulatory Visit: Payer: Self-pay | Admitting: Internal Medicine

## 2017-03-13 ENCOUNTER — Encounter: Payer: Self-pay | Admitting: Internal Medicine

## 2017-03-13 DIAGNOSIS — R928 Other abnormal and inconclusive findings on diagnostic imaging of breast: Secondary | ICD-10-CM

## 2017-04-10 ENCOUNTER — Encounter: Payer: Self-pay | Admitting: Internal Medicine

## 2017-04-10 ENCOUNTER — Ambulatory Visit (INDEPENDENT_AMBULATORY_CARE_PROVIDER_SITE_OTHER): Payer: BLUE CROSS/BLUE SHIELD | Admitting: Internal Medicine

## 2017-04-10 ENCOUNTER — Other Ambulatory Visit (HOSPITAL_COMMUNITY)
Admission: RE | Admit: 2017-04-10 | Discharge: 2017-04-10 | Disposition: A | Discharge status: Home / Self Care | Payer: BLUE CROSS/BLUE SHIELD | Source: Ambulatory Visit | Attending: Internal Medicine | Admitting: Internal Medicine

## 2017-04-10 VITALS — BP 116/78 | HR 91 | Temp 98.2°F | Ht 62.0 in | Wt 128.8 lb

## 2017-04-10 DIAGNOSIS — Z124 Encounter for screening for malignant neoplasm of cervix: Secondary | ICD-10-CM

## 2017-04-10 DIAGNOSIS — E559 Vitamin D deficiency, unspecified: Secondary | ICD-10-CM | POA: Diagnosis not present

## 2017-04-10 DIAGNOSIS — Z0000 Encounter for general adult medical examination without abnormal findings: Secondary | ICD-10-CM

## 2017-04-10 DIAGNOSIS — G43019 Migraine without aura, intractable, without status migrainosus: Secondary | ICD-10-CM | POA: Diagnosis not present

## 2017-04-10 DIAGNOSIS — E039 Hypothyroidism, unspecified: Secondary | ICD-10-CM | POA: Diagnosis not present

## 2017-04-10 DIAGNOSIS — J302 Other seasonal allergic rhinitis: Secondary | ICD-10-CM | POA: Diagnosis not present

## 2017-04-10 DIAGNOSIS — Z13220 Encounter for screening for lipoid disorders: Secondary | ICD-10-CM | POA: Diagnosis not present

## 2017-04-10 DIAGNOSIS — F341 Dysthymic disorder: Secondary | ICD-10-CM

## 2017-04-10 NOTE — Assessment & Plan Note (Signed)
Chronic but stable off meds Will monitor 

## 2017-04-10 NOTE — Assessment & Plan Note (Signed)
R/t seasonal allergies Continue Claritin and Flonase as needed

## 2017-04-10 NOTE — Assessment & Plan Note (Signed)
Will check TSH and Free T4 today Will adjust and refill Synthroid as needed based on labs

## 2017-04-10 NOTE — Patient Instructions (Signed)

## 2017-04-10 NOTE — Assessment & Plan Note (Signed)
Controlled with Claritin and Flonase as needed

## 2017-04-10 NOTE — Progress Notes (Signed)
Subjective:    Patient ID: Rachael Holloway, female    DOB: [birth year], 41 y.o.   MRN: [id number]  HPI  Pt presents to the clinic today for her annual exam. She is also due to follow up chronic conditions.  Allergies: Worse in the Spring and Fall. She takes Claritin and Flonase as needed with good relief.   Depression: Chronic dysthymia. She is currently not taking any medications for her mood at this time. She denies SI/HI.  Migraines: Triggered by her allergies. She reports that if she can take Claritin and Flonase when her symptoms start, it will often prevent a migraine.  Hypothyroidism: Her levels were last checked 01/2016. She denies any issues on her current dose of Synthroid.   Flu: 01/2017 Tetanus: 04/2010 Pap Smear: 08/2013 Mammogram: 03/2016, scheduled 04/19/2017 Vision Screening: annually, [place] Dentist: biannually  Diet: She does eat meat. She consumes fruits and veggies daily. She avoids fried foods. She drinks mostly water, some hot tea. Exercise: She rides her stationary bike for 30-60 minutes 3-5 times per week, Tai Chi 1 x week  Review of Systems      Past Medical History:  Diagnosis Date  . Allergy   . Chicken pox   . Depression   . Migraines   . Thyroid disease   . White coat hypertension     Current Outpatient Medications  Medication Sig Dispense Refill  . benzonatate (TESSALON) 100 MG capsule TAKE 1-2 CAPSULE BY MOUTH EVERY EIGHT HOURS AS NEEDED FOR COUGH  0  . Cholecalciferol (VITAMIN D3) 2000 UNITS TABS Take 2 capsules by mouth. 2 times a week    . DiphenhydrAMINE HCl (BENADRYL ALLERGY PO) Take 1 capsule by mouth as needed.    . fexofenadine (ALLEGRA) 180 MG tablet Take 180 mg by mouth daily.    . Flaxseed, Linseed, (FLAXSEED OIL PO) Take 1 capsule by mouth 2 (two) times daily.    . fluticasone (FLONASE) 50 MCG/ACT nasal spray PLACE 2 SPRAYS INTO BOTH NOSTRILS DAILY. 48 g 1  . Folate-B12-Intrinsic Factor (INTRINSI B12-FOLATE) [phone]  MCG-MCG-MG TABS Take 1 capsule by mouth daily.    Marland Kitchen HYDROcodone-homatropine (HYCODAN) 5-1.5 MG/5ML syrup Take 5 mLs by mouth every 8 (eight) hours as needed for cough. 120 mL 0  . hydrocortisone 2.5 % cream Apply topically 2 (two) times daily. 30 g 0  . ibuprofen (ADVIL,MOTRIN) 400 MG tablet Take 400 mg by mouth as needed.    Marland Kitchen levothyroxine (SYNTHROID, LEVOTHROID) 112 MCG tablet Take 1 tablet (112 mcg total) by mouth daily before breakfast. MUST SCHEDULE ANNUAL PHYSICAL FOR MORE REFILLS 30 tablet 0  . levothyroxine (SYNTHROID, LEVOTHROID) 112 MCG tablet TAKE 1 TABLET BY MOUTH EVERY MORNING BEFORE BREAKFAST 30 tablet 6  . MAGNESIUM GLYCINATE PLUS PO Take 1 tablet by mouth daily.    Marland Kitchen NIACINAMIDE-ZINC-COPPER-FA PO Take 1 capsule by mouth daily.    . VENTOLIN HFA 108 (90 Base) MCG/ACT inhaler USE 2 PUFFS EVERY 4 TO 6 HOURS AS NEEDED FOR WHEEZING, COUGH, OR SHORTNESS OF BREATH  1   No current facility-administered medications for this visit.     Allergies  Allergen Reactions  . Mold Extract [Trichophyton]   . Pollen Extract     Family History  Problem Relation Age of Onset  . Heart disease Father   . Hypertension Father   . Stroke Maternal Grandmother   . Stroke Paternal Grandfather   . Breast cancer Cousin   . Cancer Neg Hx   .  Diabetes Neg Hx     Social History   Socioeconomic History  . Marital status: Married    Spouse name: Not on file  . Number of children: Not on file  . Years of education: Not on file  . Highest education level: Not on file  Social Needs  . Financial resource strain: Not on file  . Food insecurity - worry: Not on file  . Food insecurity - inability: Not on file  . Transportation needs - medical: Not on file  . Transportation needs - non-medical: Not on file  Occupational History  . Not on file  Tobacco Use  . Smoking status: Never Smoker  . Smokeless tobacco: Never Used  Substance and Sexual Activity  . Alcohol use: Yes    Alcohol/week: 3.0 oz      Types: 5 Glasses of wine per week    Comment: moderate  . Drug use: No  . Sexual activity: Yes  Other Topics Concern  . Not on file  Social History Narrative  . Not on file     Constitutional: Denies fever, malaise, fatigue, headache or abrupt weight changes.  HEENT: Denies eye pain, eye redness, ear pain, ringing in the ears, wax buildup, runny nose, nasal congestion, bloody nose, or sore throat. Respiratory: Denies difficulty breathing, shortness of breath, cough or sputum production.   Cardiovascular: Denies chest pain, chest tightness, palpitations or swelling in the hands or feet.  Gastrointestinal: Denies abdominal pain, bloating, constipation, diarrhea or blood in the stool.  GU: Denies urgency, frequency, pain with urination, burning sensation, blood in urine, odor or discharge. Musculoskeletal: Denies decrease in range of motion, difficulty with gait, muscle pain or joint pain and swelling.  Skin: Pt reports acne on face. Denies redness, rashes, or ulcercations.  Neurological: Denies dizziness, difficulty with memory, difficulty with speech or problems with balance and coordination.  Psych: Pt has a history of depression. Denies anxiety, SI/HI.  No other specific complaints in a complete review of systems (except as listed in HPI above).  Objective:   Physical Exam  BP 116/78   Pulse 91   Temp 98.2 F (36.8 C) (Oral)   Ht '5\' 2"'$  (1.575 m)   Wt 128 lb 12 oz (58.4 kg)   LMP 03/18/2017   SpO2 99%   BMI 23.55 kg/m  Wt Readings from Last 3 Encounters:  04/10/17 128 lb 12 oz (58.4 kg)  06/22/16 133 lb (60.3 kg)  02/10/16 131 lb 8 oz (59.6 kg)    General: Appears her stated age, well developed, well nourished in NAD. Skin: Warm, dry and intact.  HEENT: Head: normal shape and size; Eyes: sclera white, no icterus, conjunctiva pink, PERRLA and EOMs intact; Ears: Tm's gray and intact, normal light reflex; Throat/Mouth: Teeth present, mucosa pink and moist, no exudate,  lesions or ulcerations noted.  Neck:  Neck supple, trachea midline. No masses, lumps present.  Cardiovascular: Normal rate and rhythm. S1,S2 noted.  No murmur, rubs or gallops noted. No JVD or BLE edema.  Pulmonary/Chest: Normal effort and positive vesicular breath sounds. No respiratory distress. No wheezes, rales or ronchi noted.  Abdomen: Soft and nontender. Normal bowel sounds. No distention or masses noted. Liver, spleen and kidneys non palpable. Pelvic: Normal female anatomy. Cervix with reparative changes noted. Adnexa non palpable. Small amount of thin yellow discharge noted. No CMT. Musculoskeletal: Strength 5/5 BUE/BLE. No difficulty with gait.  Neurological: Alert and oriented. Cranial nerves II-XII grossly intact. Coordination normal.  Psychiatric: Mood and affect  normal. Behavior is normal. Judgment and thought content normal.    BMET    Component Value Date/Time   NA 137 10/07/2014 1052   K 4.0 10/07/2014 1052   CL 104 10/07/2014 1052   CO2 29 10/07/2014 1052   GLUCOSE 95 10/07/2014 1052   BUN 8 10/07/2014 1052   CREATININE 0.78 10/07/2014 1052   CALCIUM 9.1 10/07/2014 1052    Lipid Panel     Component Value Date/Time   CHOL 179 10/07/2014 1052   TRIG 132.0 10/07/2014 1052   HDL 53.50 10/07/2014 1052   CHOLHDL 3 10/07/2014 1052   VLDL 26.4 10/07/2014 1052   LDLCALC 99 10/07/2014 1052    CBC    Component Value Date/Time   WBC 8.2 10/07/2014 1052   RBC 4.69 10/07/2014 1052   HGB 14.7 10/07/2014 1052   HCT 42.7 10/07/2014 1052   PLT 286.0 10/07/2014 1052   MCV 91.0 10/07/2014 1052   MCHC 34.5 10/07/2014 1052   RDW 12.8 10/07/2014 1052    Hgb A1C No results found for: HGBA1C          Assessment & Plan:   Preventative Health Maintenance:  Flu shot UTD Tetanus UTD Mammogram UTD Pap smear obtained- she declines STD screening Encouraged her to consume a balanced diet and exercise regimen Advised her to see an eye doctor and dentist  annually Will check CBC, CMET, Lipid, TSH, Free T4 and Vit D today  RTC in 1 year, sooner if needed Webb Silversmith, NP

## 2017-04-11 LAB — VITAMIN D 25 HYDROXY (VIT D DEFICIENCY, FRACTURES): Vit D, 25-Hydroxy: 29 ng/mL — ABNORMAL LOW (ref 30–100)

## 2017-04-11 LAB — COMPREHENSIVE METABOLIC PANEL
AG Ratio: 1.7 (calc) (ref 1.0–2.5)
ALBUMIN MSPROF: 3.9 g/dL (ref 3.6–5.1)
ALT: 8 U/L (ref 6–29)
AST: 13 U/L (ref 10–30)
Alkaline phosphatase (APISO): 71 U/L (ref 33–115)
BUN: 15 mg/dL (ref 7–25)
CO2: 26 mmol/L (ref 20–32)
CREATININE: 0.86 mg/dL (ref 0.50–1.10)
Calcium: 9 mg/dL (ref 8.6–10.2)
Chloride: 105 mmol/L (ref 98–110)
GLUCOSE: 95 mg/dL (ref 65–99)
Globulin: 2.3 g/dL (calc) (ref 1.9–3.7)
POTASSIUM: 4.7 mmol/L (ref 3.5–5.3)
Sodium: 139 mmol/L (ref 135–146)
Total Bilirubin: 0.3 mg/dL (ref 0.2–1.2)
Total Protein: 6.2 g/dL (ref 6.1–8.1)

## 2017-04-11 LAB — LIPID PANEL
Cholesterol: 180 mg/dL (ref <200)
HDL: 59 mg/dL (ref >50)
LDL Cholesterol (Calc): 97 mg/dL (calc)
Non-HDL Cholesterol (Calc): 121 mg/dL (calc) (ref <130)
Total CHOL/HDL Ratio: 3.1 (calc) (ref <5.0)
Triglycerides: 138 mg/dL (ref <150)

## 2017-04-11 LAB — CBC
HCT: 41.9 % (ref 35.0–45.0)
Hemoglobin: 14.4 g/dL (ref 11.7–15.5)
MCH: 31.1 pg (ref 27.0–33.0)
MCHC: 34.4 g/dL (ref 32.0–36.0)
MCV: 90.5 fL (ref 80.0–100.0)
MPV: 11.3 fL (ref 7.5–12.5)
PLATELETS: 290 10*3/uL (ref 140–400)
RBC: 4.63 10*6/uL (ref 3.80–5.10)
RDW: 11.5 % (ref 11.0–15.0)
WBC: 8.9 10*3/uL (ref 3.8–10.8)

## 2017-04-11 LAB — TSH: TSH: 0.36 m[IU]/L — AB

## 2017-04-11 LAB — T4, FREE: FREE T4: 1.3 ng/dL (ref 0.8–1.8)

## 2017-04-15 ENCOUNTER — Encounter: Payer: Self-pay | Admitting: Internal Medicine

## 2017-04-15 MED ORDER — LEVOTHYROXINE SODIUM 100 MCG PO TABS: 100.0000 ug | ORAL_TABLET | Freq: Every day | ORAL | 1 refills | Status: DC

## 2017-04-15 NOTE — Addendum Note (Signed)
Addended by: Lurlean Nanny on: 04/15/2017 11:31 AM   Modules accepted: Orders

## 2017-04-17 LAB — CYTOLOGY - PAP
Diagnosis: NEGATIVE
HPV: NOT DETECTED

## 2017-04-19 ENCOUNTER — Ambulatory Visit
Admission: RE | Admit: 2017-04-19 | Discharge: 2017-04-19 | Disposition: A | Discharge status: Home / Self Care | Payer: BLUE CROSS/BLUE SHIELD | Source: Ambulatory Visit | Attending: Internal Medicine | Admitting: Internal Medicine

## 2017-04-19 DIAGNOSIS — R922 Inconclusive mammogram: Secondary | ICD-10-CM | POA: Diagnosis not present

## 2017-04-19 DIAGNOSIS — R928 Other abnormal and inconclusive findings on diagnostic imaging of breast: Secondary | ICD-10-CM

## 2017-04-19 DIAGNOSIS — N6320 Unspecified lump in the left breast, unspecified quadrant: Secondary | ICD-10-CM | POA: Diagnosis present

## 2017-04-19 DIAGNOSIS — N6002 Solitary cyst of left breast: Secondary | ICD-10-CM | POA: Diagnosis not present

## 2017-04-19 DIAGNOSIS — N6322 Unspecified lump in the left breast, upper inner quadrant: Secondary | ICD-10-CM | POA: Insufficient documentation

## 2017-05-16 ENCOUNTER — Other Ambulatory Visit (INDEPENDENT_AMBULATORY_CARE_PROVIDER_SITE_OTHER): Payer: BLUE CROSS/BLUE SHIELD

## 2017-05-16 DIAGNOSIS — E039 Hypothyroidism, unspecified: Secondary | ICD-10-CM

## 2017-05-16 LAB — TSH: TSH: 2.2 u[IU]/mL (ref 0.35–4.50)

## 2017-06-11 ENCOUNTER — Encounter: Payer: Self-pay | Admitting: Internal Medicine

## 2017-06-11 DIAGNOSIS — E039 Hypothyroidism, unspecified: Secondary | ICD-10-CM

## 2017-06-11 MED ORDER — LEVOTHYROXINE SODIUM 100 MCG PO TABS: 100.0000 ug | ORAL_TABLET | Freq: Every day | ORAL | 7 refills | Status: DC

## 2018-01-08 ENCOUNTER — Other Ambulatory Visit: Payer: Self-pay | Admitting: Internal Medicine

## 2018-01-08 DIAGNOSIS — E039 Hypothyroidism, unspecified: Secondary | ICD-10-CM

## 2018-02-26 ENCOUNTER — Ambulatory Visit: Payer: BLUE CROSS/BLUE SHIELD

## 2018-03-25 ENCOUNTER — Encounter: Payer: Self-pay | Admitting: Internal Medicine

## 2018-03-31 ENCOUNTER — Other Ambulatory Visit: Payer: Self-pay | Admitting: Internal Medicine

## 2018-03-31 DIAGNOSIS — Z1231 Encounter for screening mammogram for malignant neoplasm of breast: Secondary | ICD-10-CM

## 2018-04-04 ENCOUNTER — Other Ambulatory Visit: Payer: Self-pay | Admitting: Internal Medicine

## 2018-04-04 DIAGNOSIS — E039 Hypothyroidism, unspecified: Secondary | ICD-10-CM

## 2018-04-08 ENCOUNTER — Encounter: Payer: Self-pay | Admitting: Internal Medicine

## 2018-05-02 ENCOUNTER — Ambulatory Visit
Admission: RE | Admit: 2018-05-02 | Discharge: 2018-05-02 | Disposition: A | Discharge status: Home / Self Care | Payer: BLUE CROSS/BLUE SHIELD | Source: Ambulatory Visit | Attending: Internal Medicine | Admitting: Internal Medicine

## 2018-05-02 DIAGNOSIS — Z1231 Encounter for screening mammogram for malignant neoplasm of breast: Secondary | ICD-10-CM | POA: Insufficient documentation

## 2018-06-04 ENCOUNTER — Encounter: Payer: Self-pay | Admitting: Internal Medicine

## 2018-06-04 ENCOUNTER — Ambulatory Visit (INDEPENDENT_AMBULATORY_CARE_PROVIDER_SITE_OTHER): Payer: BLUE CROSS/BLUE SHIELD | Admitting: Internal Medicine

## 2018-06-04 VITALS — BP 118/80 | HR 68 | Temp 98.6°F | Ht 62.0 in | Wt 127.0 lb

## 2018-06-04 DIAGNOSIS — E039 Hypothyroidism, unspecified: Secondary | ICD-10-CM

## 2018-06-04 DIAGNOSIS — Z0000 Encounter for general adult medical examination without abnormal findings: Secondary | ICD-10-CM

## 2018-06-04 DIAGNOSIS — F329 Major depressive disorder, single episode, unspecified: Secondary | ICD-10-CM | POA: Diagnosis not present

## 2018-06-04 DIAGNOSIS — F32A Depression, unspecified: Secondary | ICD-10-CM

## 2018-06-04 DIAGNOSIS — F419 Anxiety disorder, unspecified: Secondary | ICD-10-CM | POA: Diagnosis not present

## 2018-06-04 DIAGNOSIS — G43019 Migraine without aura, intractable, without status migrainosus: Secondary | ICD-10-CM

## 2018-06-04 NOTE — Assessment & Plan Note (Signed)
TSH and Free T4 today Will adjust Levothyroxine if needed based on labs 

## 2018-06-04 NOTE — Patient Instructions (Signed)

## 2018-06-04 NOTE — Assessment & Plan Note (Signed)
Improved off hormonal therapy Continue ibuprofen as needed Will monitor

## 2018-06-04 NOTE — Progress Notes (Signed)
Subjective:    Patient ID: Rachael Holloway, female    DOB: [birth year], 43 y.o.   MRN: [id number]  HPI  Pt presents to the clinic today for her annual exam. She is also due to follow up chronic conditions.  Anxiety and Depression: Chronic dysthymia with intermittent anxiety, but stable off meds. She denies SI/HI.  Migraines: Improved since she has been off hormonal contraception. These only occur once every few months. She takes Ibuprofen, caffeine with good relief. She also uses a cool compresses, and lays down in a dark room.  Hypothyroidism: She denies any issues on her current dose of Levothyroxine. She is due for repeat thyroid levels today.  White Coat HTN: Her BP today is 118/80. She is not taking any antihypertensive medication at this time. There is no ECG on file.   Flu: 02/2018 Tetanus: 04/2010 Pap Smear: 03/2017 Mammogram: 04/2018 Vision Screening: annually Dentist: biannually  Diet: She does eat lean meat. She consumes fruits and veggies daily. She tries to avoid fried foods. She drinks mostly water. Exercise: spin bike, bike riding 1 hour 3-5 times per week  Review of Systems      Past Medical History:  Diagnosis Date  . Allergy   . Chicken pox   . Depression   . Migraines   . Thyroid disease   . White coat hypertension     Current Outpatient Medications  Medication Sig Dispense Refill  . Cholecalciferol (VITAMIN D3) 2000 UNITS TABS Take 2 capsules by mouth. 2 times a week    . DiphenhydrAMINE HCl (BENADRYL ALLERGY PO) Take 1 capsule by mouth as needed.    . fexofenadine (ALLEGRA) 180 MG tablet Take 180 mg by mouth daily as needed.     . Flaxseed, Linseed, (FLAXSEED OIL PO) Take 1 capsule by mouth 2 (two) times daily.    Derald Macleod Factor (INTRINSI B12-FOLATE) [phone] MCG-MCG-MG TABS Take 1 capsule by mouth daily.    Marland Kitchen ibuprofen (ADVIL,MOTRIN) 400 MG tablet Take 400 mg by mouth as needed.    Marland Kitchen levothyroxine (SYNTHROID, LEVOTHROID) 100  MCG tablet TAKE 1 TABLET BY MOUTH EVERY DAY 90 tablet 0  . MAGNESIUM GLYCINATE PLUS PO Take 1 tablet by mouth daily.    Marland Kitchen NIACINAMIDE-ZINC-COPPER-FA PO Take 1 capsule by mouth daily.     No current facility-administered medications for this visit.     Allergies  Allergen Reactions  . Dust Mite Mixed Allergen Ext [Mite (D. Farinae)]   . Mold Extract [Trichophyton]   . Pollen Extract     Family History  Problem Relation Age of Onset  . Heart disease Father   . Hypertension Father   . Stroke Maternal Grandmother   . Stroke Paternal Grandfather   . Breast cancer Cousin   . Cancer Neg Hx   . Diabetes Neg Hx     Social History   Socioeconomic History  . Marital status: Married    Spouse name: Not on file  . Number of children: Not on file  . Years of education: Not on file  . Highest education level: Not on file  Occupational History  . Not on file  Social Needs  . Financial resource strain: Not on file  . Food insecurity:    Worry: Not on file    Inability: Not on file  . Transportation needs:    Medical: Not on file    Non-medical: Not on file  Tobacco Use  . Smoking status: Never Smoker  . Smokeless tobacco:  Never Used  Substance and Sexual Activity  . Alcohol use: Yes    Alcohol/week: 5.0 standard drinks    Types: 5 Glasses of wine per week    Comment: moderate  . Drug use: No  . Sexual activity: Yes  Lifestyle  . Physical activity:    Days per week: Not on file    Minutes per session: Not on file  . Stress: Not on file  Relationships  . Social connections:    Talks on phone: Not on file    Gets together: Not on file    Attends religious service: Not on file    Active member of club or organization: Not on file    Attends meetings of clubs or organizations: Not on file    Relationship status: Not on file  . Intimate partner violence:    Fear of current or ex partner: Not on file    Emotionally abused: Not on file    Physically abused: Not on file     Forced sexual activity: Not on file  Other Topics Concern  . Not on file  Social History Narrative  . Not on file     Constitutional: Denies fever, malaise, fatigue, headache or abrupt weight changes.  HEENT: Denies eye pain, eye redness, ear pain, ringing in the ears, wax buildup, runny nose, nasal congestion, bloody nose, or sore throat. Respiratory: Denies difficulty breathing, shortness of breath, cough or sputum production.   Cardiovascular: Denies chest pain, chest tightness, palpitations or swelling in the hands or feet.  Gastrointestinal: Denies abdominal pain, bloating, constipation, diarrhea or blood in the stool.  GU: Pt reports decreased sex drive. Denies urgency, frequency, pain with urination, burning sensation, blood in urine, odor or discharge. Musculoskeletal: Pt reports intermittent foot pain. Denies decrease in range of motion, difficulty with gait, muscle pain or joint swelling.  Skin: Denies redness, rashes, lesions or ulcercations.  Neurological: Denies dizziness, difficulty with memory, difficulty with speech or problems with balance and coordination.  Psych: Pt has a history of anxiety and depression. Denies SI/HI.  No other specific complaints in a complete review of systems (except as listed in HPI above).  Objective:   Physical Exam  BP 118/80   Pulse 68   Temp 98.6 F (37 C) (Oral)   Ht 5\' 2"  (1.575 m)   Wt 127 lb (57.6 kg)   LMP 06/01/2018   SpO2 98%   BMI 23.23 kg/m  Wt Readings from Last 3 Encounters:  06/04/18 127 lb (57.6 kg)  04/10/17 128 lb 12 oz (58.4 kg)  06/22/16 133 lb (60.3 kg)    General: Appears her stated age, well developed, well nourished in NAD. Skin: Warm, dry and intact.  HEENT: Head: normal shape and size; Eyes: sclera white, no icterus, conjunctiva pink, PERRLA and EOMs intact; Ears: Tm's gray and intact, normal light reflex;  Throat/Mouth: Teeth present, mucosa pink and moist, no exudate, lesions or ulcerations noted.    Neck:  Neck supple, trachea midline. No masses, lumps or thyromegaly present.  Cardiovascular: Normal rate and rhythm. S1,S2 noted.  No murmur, rubs or gallops noted. No JVD or BLE edema.  Pulmonary/Chest: Normal effort and positive vesicular breath sounds. No respiratory distress. No wheezes, rales or ronchi noted.  Abdomen: Soft and nontender. Normal bowel sounds. No distention or masses noted. Liver, spleen and kidneys non palpable. Musculoskeletal: Strength 5/5 BUE/BLE. No difficulty with gait.  Neurological: Alert and oriented. Cranial nerves II-XII grossly intact. Coordination normal.  Psychiatric: Mood  and affect normal. Behavior is normal. Judgment and thought content normal.     BMET    Component Value Date/Time   NA 139 04/10/2017 1547   K 4.7 04/10/2017 1547   CL 105 04/10/2017 1547   CO2 26 04/10/2017 1547   GLUCOSE 95 04/10/2017 1547   BUN 15 04/10/2017 1547   CREATININE 0.86 04/10/2017 1547   CALCIUM 9.0 04/10/2017 1547    Lipid Panel     Component Value Date/Time   CHOL 180 04/10/2017 1547   TRIG 138 04/10/2017 1547   HDL 59 04/10/2017 1547   CHOLHDL 3.1 04/10/2017 1547   VLDL 26.4 10/07/2014 1052   LDLCALC 97 04/10/2017 1547    CBC    Component Value Date/Time   WBC 8.9 04/10/2017 1547   RBC 4.63 04/10/2017 1547   HGB 14.4 04/10/2017 1547   HCT 41.9 04/10/2017 1547   PLT 290 04/10/2017 1547   MCV 90.5 04/10/2017 1547   MCH 31.1 04/10/2017 1547   MCHC 34.4 04/10/2017 1547   RDW 11.5 04/10/2017 1547    Hgb A1C No results found for: HGBA1C          Assessment & Plan:   Preventative Health Maintenance:  Flu shot UTD Tetanus UTD Pap smear UTD Mammogram UTD Encouraged her to consume a balanced diet and exercise regimen Advised her to see an eye doctor and dentist annually Will check CBC, CMET, TSH, Free T4 and Vit D today  RTC in 1 year, sooner if needed Webb Silversmith, NP

## 2018-06-04 NOTE — Assessment & Plan Note (Signed)
Chronic but stable off meds She is not interested in initiating medication at this time Support offered today

## 2018-06-05 ENCOUNTER — Other Ambulatory Visit: Payer: Self-pay | Admitting: Internal Medicine

## 2018-06-05 ENCOUNTER — Encounter: Payer: Self-pay | Admitting: Internal Medicine

## 2018-06-05 DIAGNOSIS — E559 Vitamin D deficiency, unspecified: Secondary | ICD-10-CM

## 2018-06-05 LAB — COMPREHENSIVE METABOLIC PANEL
ALK PHOS: 65 IU/L (ref 39–117)
ALT: 12 IU/L (ref 0–32)
AST: 17 IU/L (ref 0–40)
Albumin/Globulin Ratio: 1.8 (ref 1.2–2.2)
Albumin: 4.3 g/dL (ref 3.5–5.5)
BUN/Creatinine Ratio: 21 (ref 9–23)
BUN: 14 mg/dL (ref 6–24)
Bilirubin Total: 0.3 mg/dL (ref 0.0–1.2)
CALCIUM: 9.6 mg/dL (ref 8.7–10.2)
CO2: 20 mmol/L (ref 20–29)
CREATININE: 0.67 mg/dL (ref 0.57–1.00)
Chloride: 103 mmol/L (ref 96–106)
GFR calc Af Amer: 124 mL/min/{1.73_m2} (ref >59)
GFR, EST NON AFRICAN AMERICAN: 107 mL/min/{1.73_m2} (ref >59)
GLOBULIN, TOTAL: 2.4 g/dL (ref 1.5–4.5)
GLUCOSE: 91 mg/dL (ref 65–99)
Potassium: 4.5 mmol/L (ref 3.5–5.2)
SODIUM: 140 mmol/L (ref 134–144)
Total Protein: 6.7 g/dL (ref 6.0–8.5)

## 2018-06-05 LAB — CBC
HEMATOCRIT: 42.4 % (ref 34.0–46.6)
Hemoglobin: 14.3 g/dL (ref 11.1–15.9)
MCH: 31 pg (ref 26.6–33.0)
MCHC: 33.7 g/dL (ref 31.5–35.7)
MCV: 92 fL (ref 79–97)
Platelets: 298 10*3/uL (ref 150–450)
RBC: 4.61 x10E6/uL (ref 3.77–5.28)
RDW: 11.8 % (ref 11.7–15.4)
WBC: 7.5 10*3/uL (ref 3.4–10.8)

## 2018-06-05 LAB — LIPID PANEL
CHOLESTEROL TOTAL: 219 mg/dL — AB (ref 100–199)
Chol/HDL Ratio: 3 ratio (ref 0.0–4.4)
HDL: 74 mg/dL (ref >39)
LDL Calculated: 110 mg/dL — ABNORMAL HIGH (ref 0–99)
TRIGLYCERIDES: 176 mg/dL — AB (ref 0–149)
VLDL CHOLESTEROL CAL: 35 mg/dL (ref 5–40)

## 2018-06-05 LAB — TSH: TSH: 3.74 u[IU]/mL (ref 0.450–4.500)

## 2018-06-05 LAB — T4, FREE: FREE T4: 1.37 ng/dL (ref 0.82–1.77)

## 2018-06-05 LAB — VITAMIN D 25 HYDROXY (VIT D DEFICIENCY, FRACTURES): VIT D 25 HYDROXY: 19.7 ng/mL — AB (ref 30.0–100.0)

## 2018-06-05 MED ORDER — VITAMIN D (ERGOCALCIFEROL) 1.25 MG (50000 UNIT) PO CAPS: 50000.0000 [IU] | ORAL_CAPSULE | ORAL | 0 refills | Status: DC

## 2018-07-12 ENCOUNTER — Other Ambulatory Visit: Payer: Self-pay | Admitting: Internal Medicine

## 2018-07-12 DIAGNOSIS — E039 Hypothyroidism, unspecified: Secondary | ICD-10-CM

## 2018-07-14 MED ORDER — LEVOTHYROXINE SODIUM 100 MCG PO TABS: 100.0000 ug | ORAL_TABLET | Freq: Every day | ORAL | 2 refills | Status: DC

## 2018-09-29 ENCOUNTER — Encounter: Payer: Self-pay | Admitting: Internal Medicine

## 2018-09-30 ENCOUNTER — Ambulatory Visit (INDEPENDENT_AMBULATORY_CARE_PROVIDER_SITE_OTHER): Payer: BLUE CROSS/BLUE SHIELD | Admitting: Internal Medicine

## 2018-09-30 ENCOUNTER — Encounter: Payer: Self-pay | Admitting: Internal Medicine

## 2018-09-30 DIAGNOSIS — F329 Major depressive disorder, single episode, unspecified: Secondary | ICD-10-CM

## 2018-09-30 DIAGNOSIS — F419 Anxiety disorder, unspecified: Secondary | ICD-10-CM | POA: Diagnosis not present

## 2018-09-30 DIAGNOSIS — F32A Depression, unspecified: Secondary | ICD-10-CM

## 2018-09-30 MED ORDER — ESCITALOPRAM OXALATE 10 MG PO TABS: 10.0000 mg | ORAL_TABLET | Freq: Every day | ORAL | 2 refills | Status: DC

## 2018-09-30 NOTE — Progress Notes (Signed)
Virtual Visit via Video Note  I connected with Rachael Holloway on 09/30/18 at  3:00 PM EDT by a video enabled telemedicine application and verified that I am speaking with the correct person using two identifiers.  Location: Patient: [facility] Provider: Office   I discussed the limitations of evaluation and management by telemedicine and the availability of in person appointments. The patient expressed understanding and agreed to proceed.  History of Present Illness:  Pt wanting to discuss anxiety and depression. She reports this has always been an issue for her. She usually does really well at coping. However, with this pandemic, she is not coping as well. She is always home. She works from home. Going out is frusterating for her. She is sleeping more than usual. Her appetite is poor. She denies SI/HI. She has taken Lexapro in the past. She has limited support- mainly her husband. She is not currently seeing a therapist but thinks this would be good for her.     Past Medical History:  Diagnosis Date  . Allergy   . Chicken pox   . Depression   . Migraines   . Thyroid disease   . White coat hypertension     Current Outpatient Medications  Medication Sig Dispense Refill  . Cholecalciferol (VITAMIN D3) 2000 UNITS TABS Take 2 capsules by mouth. 2 times a week    . DiphenhydrAMINE HCl (BENADRYL ALLERGY PO) Take 1 capsule by mouth as needed.    . fexofenadine (ALLEGRA) 180 MG tablet Take 180 mg by mouth daily as needed.     . Flaxseed, Linseed, (FLAXSEED OIL PO) Take 1 capsule by mouth 2 (two) times daily.    Derald Macleod Factor (INTRINSI B12-FOLATE) [phone] MCG-MCG-MG TABS Take 1 capsule by mouth daily.    Marland Kitchen ibuprofen (ADVIL,MOTRIN) 400 MG tablet Take 400 mg by mouth as needed.    Marland Kitchen levothyroxine (SYNTHROID, LEVOTHROID) 100 MCG tablet Take 1 tablet (100 mcg total) by mouth daily. 90 tablet 2  . MAGNESIUM GLYCINATE PLUS PO Take 1 tablet by mouth daily.    Marland Kitchen  NIACINAMIDE-ZINC-COPPER-FA PO Take 1 capsule by mouth daily.    . Vitamin D, Ergocalciferol, (DRISDOL) 1.25 MG (50000 UT) CAPS capsule Take 1 capsule (50,000 Units total) by mouth every 7 (seven) days. 12 capsule 0   No current facility-administered medications for this visit.     Allergies  Allergen Reactions  . Dust Mite Mixed Allergen Ext [Mite (D. Farinae)]   . Mold Extract [Trichophyton]   . Pollen Extract     Family History  Problem Relation Age of Onset  . Heart disease Father   . Hypertension Father   . Stroke Maternal Grandmother   . Stroke Paternal Grandfather   . Breast cancer Cousin   . Cancer Neg Hx   . Diabetes Neg Hx     Social History   Socioeconomic History  . Marital status: Married    Spouse name: Not on file  . Number of children: Not on file  . Years of education: Not on file  . Highest education level: Not on file  Occupational History  . Not on file  Social Needs  . Financial resource strain: Not on file  . Food insecurity:    Worry: Not on file    Inability: Not on file  . Transportation needs:    Medical: Not on file    Non-medical: Not on file  Tobacco Use  . Smoking status: Never Smoker  . Smokeless tobacco: Never  Used  Substance and Sexual Activity  . Alcohol use: Yes    Alcohol/week: 5.0 standard drinks    Types: 5 Glasses of wine per week    Comment: moderate  . Drug use: No  . Sexual activity: Yes  Lifestyle  . Physical activity:    Days per week: Not on file    Minutes per session: Not on file  . Stress: Not on file  Relationships  . Social connections:    Talks on phone: Not on file    Gets together: Not on file    Attends religious service: Not on file    Active member of club or organization: Not on file    Attends meetings of clubs or organizations: Not on file    Relationship status: Not on file  . Intimate partner violence:    Fear of current or ex partner: Not on file    Emotionally abused: Not on file     Physically abused: Not on file    Forced sexual activity: Not on file  Other Topics Concern  . Not on file  Social History Narrative  . Not on file     Constitutional: Denies fever, malaise, fatigue, headache or abrupt weight changes.  lty with memory, difficulty with speech or problems with balance and coordination.  Psych: Pt reports anxiety, depression. Denies SI/HI.  No other specific complaints in a complete review of systems (except as listed in HPI above).  Wt Readings from Last 3 Encounters:  06/04/18 127 lb (57.6 kg)  04/10/17 128 lb 12 oz (58.4 kg)  06/22/16 133 lb (60.3 kg)    General: Appears her stated age, well developed, well nourished in NAD. Pulmonary/Chest: Normal effort. No respiratory distress.  Neurological: Alert and oriented.  Psychiatric: Mood and affect normal. Behavior is normal. Judgment and thought content normal.     BMET    Component Value Date/Time   NA 140 06/04/2018 1449   K 4.5 06/04/2018 1449   CL 103 06/04/2018 1449   CO2 20 06/04/2018 1449   GLUCOSE 91 06/04/2018 1449   GLUCOSE 95 04/10/2017 1547   BUN 14 06/04/2018 1449   CREATININE 0.67 06/04/2018 1449   CREATININE 0.86 04/10/2017 1547   CALCIUM 9.6 06/04/2018 1449   GFRNONAA 107 06/04/2018 1449   GFRAA 124 06/04/2018 1449    Lipid Panel     Component Value Date/Time   CHOL 219 (H) 06/04/2018 1449   TRIG 176 (H) 06/04/2018 1449   HDL 74 06/04/2018 1449   CHOLHDL 3.0 06/04/2018 1449   CHOLHDL 3.1 04/10/2017 1547   VLDL 26.4 10/07/2014 1052   LDLCALC 110 (H) 06/04/2018 1449   LDLCALC 97 04/10/2017 1547    CBC    Component Value Date/Time   WBC 7.5 06/04/2018 1449   WBC 8.9 04/10/2017 1547   RBC 4.61 06/04/2018 1449   RBC 4.63 04/10/2017 1547   HGB 14.3 06/04/2018 1449   HCT 42.4 06/04/2018 1449   PLT 298 06/04/2018 1449   MCV 92 06/04/2018 1449   MCH 31.0 06/04/2018 1449   MCH 31.1 04/10/2017 1547   MCHC 33.7 06/04/2018 1449   MCHC 34.4 04/10/2017 1547   RDW  11.8 06/04/2018 1449    Hgb A1C No results found for: HGBA1C      Assessment and Plan:  Anxiety and Depression:  Support offered today Will restart Lexapro 10 mg daily Referral for therapy placed Discussed stress relieving techniques  Update me in 1 month, via mycahrt and  let me know how you are doing.  Follow Up Instructions:    I discussed the assessment and treatment plan with the patient. The patient was provided an opportunity to ask questions and all were answered. The patient agreed with the plan and demonstrated an understanding of the instructions.   The patient was advised to call back or seek an in-person evaluation if the symptoms worsen or if the condition fails to improve as anticipated.    Webb Silversmith, NP

## 2018-09-30 NOTE — Patient Instructions (Signed)
Depression Screening Depression screening is a tool that your health care provider can use to learn if you have symptoms of depression. Depression is a common condition with many symptoms that are also often found in other conditions. Depression is treatable, but it must first be diagnosed. You may not know that certain feelings, thoughts, and behaviors that you are having can be symptoms of depression. Taking a depression screening test can help you and your health care provider decide if you need more assessment, or if you should be referred to a mental health care provider. What are the screening tests?  You may have a physical exam to see if another condition is affecting your mental health. You may have a blood or urine sample taken during the physical exam.  You may be interviewed using a screening tool that was developed from research, such as one of these: ? Patient Health Questionnaire (PHQ). This is a set of either 2 or 9 questions. A health care provider who has been trained to score this screening test uses a guide to assess if your symptoms suggest that you may have depression. ? Hamilton Depression Rating Scale (HAM-D). This is a set of either 17 or 24 questions. You may be asked to take it again during or after your treatment, to see if your depression has gotten better. ? Beck Depression Inventory (BDI). This is a set of 21 multiple choice questions. Your health care provider scores your answers to assess:  Your level of depression, ranging from mild to severe.  Your response to treatment.  Your health care provider may talk with you about your daily activities, such as eating, sleeping, work, and recreation, and ask if you have had any changes in activity.  Your health care provider may ask you to see a mental health specialist, such as a psychiatrist or psychologist, for more evaluation. Who should be screened for depression?   All adults, including adults with a family history  of a mental health disorder.  Adolescents who are 12-18 years old.  People who are recovering from a myocardial infarction (MI).  Pregnant women, or women who have given birth.  People who have a long-term (chronic) illness.  Anyone who has been diagnosed with another type of a mental health disorder.  Anyone who has symptoms that could show depression. What do my results mean? Your health care provider will review the results of your depression screening, physical exam, and lab tests. Positive screens suggest that you may have depression. Screening is the first step in getting the care that you may need. It is up to you to get your screening results. Ask your health care provider, or the department that is doing your screening tests, when your results will be ready. Talk with your health care provider about your results and diagnosis. A diagnosis of depression is made using the Diagnostic and Statistical Manual of Mental Disorders (DSM-V). This is a book that lists the number and type of symptoms that must be present for a health care provider to give a specific diagnosis.  Your health care provider may work with you to treat your symptoms of depression, or your health care provider may help you find a mental health provider who can assess, diagnose, and treat your depression. Get help right away if:  You have thoughts about hurting yourself or others. If you ever feel like you may hurt yourself or others, or have thoughts about taking your own life, get help right away. You   can go to your nearest emergency department or call:  Your local emergency services (911 in the [place]  A suicide crisis helpline, such as the National Suicide Prevention Lifeline at 1-800-273-8255. This is open 24 hours a day. Summary  Depression screening is the first step in getting the help that you may need.  If your screening test shows symptoms of depression (is positive), your health care provider may ask  you to see a mental health provider.  Anyone who is age 12 or older should be screened for depression. This information is not intended to replace advice given to you by your health care provider. Make sure you discuss any questions you have with your health care provider. Document Released: 09/21/2016 Document Revised: 09/21/2016 Document Reviewed: 09/21/2016 Elsevier Interactive Patient Education  2019 Elsevier Inc.  

## 2018-10-14 ENCOUNTER — Ambulatory Visit (INDEPENDENT_AMBULATORY_CARE_PROVIDER_SITE_OTHER): Payer: BLUE CROSS/BLUE SHIELD | Admitting: Psychology

## 2018-10-14 DIAGNOSIS — F3289 Other specified depressive episodes: Secondary | ICD-10-CM | POA: Diagnosis not present

## 2018-10-14 DIAGNOSIS — F418 Other specified anxiety disorders: Secondary | ICD-10-CM | POA: Diagnosis not present

## 2018-10-23 ENCOUNTER — Other Ambulatory Visit: Payer: Self-pay | Admitting: Internal Medicine

## 2018-10-25 ENCOUNTER — Encounter: Payer: Self-pay | Admitting: Internal Medicine

## 2018-10-25 NOTE — Telephone Encounter (Signed)
Sent msg via mychart for update since starting medication

## 2018-10-27 MED ORDER — ESCITALOPRAM OXALATE 10 MG PO TABS: 10.0000 mg | ORAL_TABLET | Freq: Every day | ORAL | 1 refills | Status: DC

## 2018-10-27 NOTE — Addendum Note (Signed)
Addended by: Lurlean Nanny on: 10/27/2018 08:25 AM   Modules accepted: Orders

## 2018-10-29 ENCOUNTER — Ambulatory Visit (INDEPENDENT_AMBULATORY_CARE_PROVIDER_SITE_OTHER): Payer: BC Managed Care – PPO | Admitting: Psychology

## 2018-10-29 DIAGNOSIS — F3289 Other specified depressive episodes: Secondary | ICD-10-CM

## 2018-10-29 DIAGNOSIS — F418 Other specified anxiety disorders: Secondary | ICD-10-CM | POA: Diagnosis not present

## 2018-11-06 ENCOUNTER — Encounter: Payer: Self-pay | Admitting: Internal Medicine

## 2018-11-06 ENCOUNTER — Other Ambulatory Visit: Payer: Self-pay

## 2018-11-06 ENCOUNTER — Ambulatory Visit (INDEPENDENT_AMBULATORY_CARE_PROVIDER_SITE_OTHER): Payer: BC Managed Care – PPO | Admitting: Internal Medicine

## 2018-11-06 VITALS — BP 122/76 | HR 90 | Temp 98.8°F | Wt 132.0 lb

## 2018-11-06 DIAGNOSIS — R002 Palpitations: Secondary | ICD-10-CM | POA: Diagnosis not present

## 2018-11-06 NOTE — Progress Notes (Signed)
Subjective:    Patient ID: Rachael Holloway, female    DOB: Nov 19, 1975, 43 y.o.   MRN: [id number]  HPI  Pt presents to the clinic today with c/o palpitations. She reports this started 2-3 weeks ago. She reports it will feel like her heart is skipping a beat. She denies rapid heart rate. She denies feelings of dizziness, chest pain, shortness of breath or syncope. She reports her HR at home usually runs about 65-85. Her blood pressure and oxygen saturations at home have been normal. She started Lexapro about 5 weeks ago and is not sure if this is a contributing factor. She is under some stress and has been having issues with anxiety and depression, which she does feel like the Lexapro has helped. She has recently increased her caffeine intake. She does have a history of hypothyroidism, is taking Levothyroxine as prescribed.    Review of Systems      Past Medical History:  Diagnosis Date  . Allergy   . Chicken pox   . Depression   . Migraines   . Thyroid disease   . White coat hypertension     Current Outpatient Medications  Medication Sig Dispense Refill  . Cholecalciferol (VITAMIN D3) 2000 UNITS TABS Take 2 capsules by mouth. 2 times a week    . DiphenhydrAMINE HCl (BENADRYL ALLERGY PO) Take 1 capsule by mouth as needed.    Marland Kitchen escitalopram (LEXAPRO) 10 MG tablet Take 1 tablet (10 mg total) by mouth daily. 90 tablet 1  . fexofenadine (ALLEGRA) 180 MG tablet Take 180 mg by mouth daily as needed.     . Flaxseed, Linseed, (FLAXSEED OIL PO) Take 1 capsule by mouth 2 (two) times daily.    Derald Macleod Factor (INTRINSI B12-FOLATE) 970-263-78 MCG-MCG-MG TABS Take 1 capsule by mouth daily.    Marland Kitchen ibuprofen (ADVIL,MOTRIN) 400 MG tablet Take 400 mg by mouth as needed.    Marland Kitchen levothyroxine (SYNTHROID, LEVOTHROID) 100 MCG tablet Take 1 tablet (100 mcg total) by mouth daily. 90 tablet 2  . MAGNESIUM GLYCINATE PLUS PO Take 1 tablet by mouth daily.    Marland Kitchen NIACINAMIDE-ZINC-COPPER-FA PO Take 1  capsule by mouth daily.    . Vitamin D, Ergocalciferol, (DRISDOL) 1.25 MG (50000 UT) CAPS capsule Take 1 capsule (50,000 Units total) by mouth every 7 (seven) days. 12 capsule 0   No current facility-administered medications for this visit.     Allergies  Allergen Reactions  . Dust Mite Mixed Allergen Ext [Mite (D. Farinae)]   . Mold Extract [Trichophyton]   . Pollen Extract     Family History  Problem Relation Age of Onset  . Heart disease Father   . Hypertension Father   . Stroke Maternal Grandmother   . Stroke Paternal Grandfather   . Breast cancer Cousin   . Cancer Neg Hx   . Diabetes Neg Hx     Social History   Socioeconomic History  . Marital status: Married    Spouse name: Not on file  . Number of children: Not on file  . Years of education: Not on file  . Highest education level: Not on file  Occupational History  . Not on file  Social Needs  . Financial resource strain: Not on file  . Food insecurity    Worry: Not on file    Inability: Not on file  . Transportation needs    Medical: Not on file    Non-medical: Not on file  Tobacco Use  .  Smoking status: Never Smoker  . Smokeless tobacco: Never Used  Substance and Sexual Activity  . Alcohol use: Yes    Alcohol/week: 5.0 standard drinks    Types: 5 Glasses of wine per week    Comment: moderate  . Drug use: No  . Sexual activity: Yes  Lifestyle  . Physical activity    Days per week: Not on file    Minutes per session: Not on file  . Stress: Not on file  Relationships  . Social Herbalist on phone: Not on file    Gets together: Not on file    Attends religious service: Not on file    Active member of club or organization: Not on file    Attends meetings of clubs or organizations: Not on file    Relationship status: Not on file  . Intimate partner violence    Fear of current or ex partner: Not on file    Emotionally abused: Not on file    Physically abused: Not on file    Forced  sexual activity: Not on file  Other Topics Concern  . Not on file  Social History Narrative  . Not on file     Constitutional: Denies fever, malaise, fatigue, headache or abrupt weight changes.    Cardiovascular: Pt reports palpitations. Denies chest pain, chest tightness, or swelling in the hands or feet.  Neurological: Denies dizziness, difficulty with memory, difficulty with speech or problems with balance and coordination.  Psych: Pt has a history of anxiety and depression. Denies SI/HI.  No other specific complaints in a complete review of systems (except as listed in HPI above).  Objective:   Physical Exam   BP 122/76   Pulse 90   Temp 98.8 F (37.1 C) (Oral)   Wt 132 lb (59.9 kg)   SpO2 98%   BMI 24.14 kg/m  Wt Readings from Last 3 Encounters:  11/06/18 132 lb (59.9 kg)  06/04/18 127 lb (57.6 kg)  04/10/17 128 lb 12 oz (58.4 kg)    General: Appears her stated age, well developed, well nourished in NAD. Neck:  Neck supple, trachea midline. No masses, lumps present.  Cardiovascular: Normal rate and rhythm. S1,S2 noted.  No murmur, rubs or gallops noted.  Pulmonary/Chest: Normal effort and positive vesicular breath sounds. No respiratory distress. No wheezes, rales or ronchi noted.  Neurological: Alert and oriented.  Psychiatric: Mood and affect normal. Behavior is normal. Judgment and thought content normal.     BMET    Component Value Date/Time   NA 140 06/04/2018 1449   K 4.5 06/04/2018 1449   CL 103 06/04/2018 1449   CO2 20 06/04/2018 1449   GLUCOSE 91 06/04/2018 1449   GLUCOSE 95 04/10/2017 1547   BUN 14 06/04/2018 1449   CREATININE 0.67 06/04/2018 1449   CREATININE 0.86 04/10/2017 1547   CALCIUM 9.6 06/04/2018 1449   GFRNONAA 107 06/04/2018 1449   GFRAA 124 06/04/2018 1449    Lipid Panel     Component Value Date/Time   CHOL 219 (H) 06/04/2018 1449   TRIG 176 (H) 06/04/2018 1449   HDL 74 06/04/2018 1449   CHOLHDL 3.0 06/04/2018 1449   CHOLHDL  3.1 04/10/2017 1547   VLDL 26.4 10/07/2014 1052   LDLCALC 110 (H) 06/04/2018 1449   LDLCALC 97 04/10/2017 1547    CBC    Component Value Date/Time   WBC 7.5 06/04/2018 1449   WBC 8.9 04/10/2017 1547   RBC 4.61 06/04/2018  1449   RBC 4.63 04/10/2017 1547   HGB 14.3 06/04/2018 1449   HCT 42.4 06/04/2018 1449   PLT 298 06/04/2018 1449   MCV 92 06/04/2018 1449   MCH 31.0 06/04/2018 1449   MCH 31.1 04/10/2017 1547   MCHC 33.7 06/04/2018 1449   MCHC 34.4 04/10/2017 1547   RDW 11.8 06/04/2018 1449    Hgb A1C No results found for: HGBA1C         Assessment & Plan:   Palpitations:  Indication for ECG: palpitations Interpretation of ECG: normal rate and rhythm, nonspecific t wave abnormality Comparison of ECG: none Will check TSH, Free T4, BMET and Mg today If labs normal, considering holding the Lexapro for 2 weeks to see if palpitations persist Consider cardiology referral if palpitations persist  Return precautions discussed Webb Silversmith, NP

## 2018-11-06 NOTE — Patient Instructions (Signed)
Palpitations  Palpitations are feelings that your heartbeat is not normal. Your heartbeat may feel like it is:   Uneven.   Faster than normal.   Fluttering.   Skipping a beat.  This is usually not a serious problem. In some cases, you may need tests to rule out any serious problems.  Follow these instructions at home:  Pay attention to any changes in your condition. Take these actions to help manage your symptoms:  Eating and drinking   Avoid:  ? Coffee, tea, soft drinks, and energy drinks.  ? Chocolate.  ? Alcohol.  ? Diet pills.  Lifestyle     Try to lower your stress. These things can help you relax:  ? Yoga.  ? Deep breathing and meditation.  ? Exercise.  ? Using words and images to create positive thoughts (guided imagery).  ? Using your mind to control things in your body (biofeedback).   Do not use drugs.   Get plenty of rest and sleep. Keep a regular bed time.  General instructions     Take over-the-counter and prescription medicines only as told by your doctor.   Do not use any products that contain nicotine or tobacco, such as cigarettes and e-cigarettes. If you need help quitting, ask your doctor.   Keep all follow-up visits as told by your doctor. This is important. You may need more tests if palpitations do not go away or get worse.  Contact a doctor if:   Your symptoms last more than 24 hours.   Your symptoms occur more often.  Get help right away if you:   Have chest pain.   Feel short of breath.   Have a very bad headache.   Feel dizzy.   Pass out (faint).  Summary   Palpitations are feelings that your heartbeat is uneven or faster than normal. It may feel like your heart is fluttering or skipping a beat.   Avoid food and drinks that may cause palpitations. These include caffeine, chocolate, and alcohol.   Try to lower your stress. Do not smoke or use drugs.   Get help right away if you faint or have chest pain, shortness of breath, a severe headache, or dizziness.  This  information is not intended to replace advice given to you by your health care provider. Make sure you discuss any questions you have with your health care provider.  Document Released: 02/14/2008 Document Revised: 06/19/2017 Document Reviewed: 06/19/2017  Elsevier Interactive Patient Education  2019 Elsevier Inc.

## 2018-11-11 LAB — MAGNESIUM: Magnesium: 2.1 mg/dL (ref 1.6–2.3)

## 2018-11-11 LAB — SPECIMEN STATUS REPORT

## 2018-11-11 LAB — BASIC METABOLIC PANEL
BUN/Creatinine Ratio: 12 (ref 9–23)
BUN: 10 mg/dL (ref 6–24)
CO2: 21 mmol/L (ref 20–29)
Calcium: 9.3 mg/dL (ref 8.7–10.2)
Chloride: 106 mmol/L (ref 96–106)
Creatinine, Ser: 0.83 mg/dL (ref 0.57–1.00)
GFR calc Af Amer: 99 mL/min/{1.73_m2} (ref >59)
GFR calc non Af Amer: 86 mL/min/{1.73_m2} (ref >59)
Glucose: 90 mg/dL (ref 65–99)
Potassium: 4.5 mmol/L (ref 3.5–5.2)
Sodium: 142 mmol/L (ref 134–144)

## 2018-11-11 LAB — TSH: TSH: 1.17 u[IU]/mL (ref 0.450–4.500)

## 2018-11-11 LAB — T4, FREE: Free T4: 1.3 ng/dL (ref 0.82–1.77)

## 2018-11-13 ENCOUNTER — Ambulatory Visit (INDEPENDENT_AMBULATORY_CARE_PROVIDER_SITE_OTHER): Payer: BC Managed Care – PPO | Admitting: Psychology

## 2018-11-13 DIAGNOSIS — F418 Other specified anxiety disorders: Secondary | ICD-10-CM

## 2018-11-13 DIAGNOSIS — F3289 Other specified depressive episodes: Secondary | ICD-10-CM | POA: Diagnosis not present

## 2018-12-03 ENCOUNTER — Ambulatory Visit (INDEPENDENT_AMBULATORY_CARE_PROVIDER_SITE_OTHER): Payer: BC Managed Care – PPO | Admitting: Psychology

## 2018-12-03 DIAGNOSIS — F3289 Other specified depressive episodes: Secondary | ICD-10-CM

## 2018-12-03 DIAGNOSIS — F418 Other specified anxiety disorders: Secondary | ICD-10-CM | POA: Diagnosis not present

## 2018-12-31 ENCOUNTER — Ambulatory Visit (INDEPENDENT_AMBULATORY_CARE_PROVIDER_SITE_OTHER): Payer: BC Managed Care – PPO | Admitting: Psychology

## 2018-12-31 DIAGNOSIS — F3289 Other specified depressive episodes: Secondary | ICD-10-CM

## 2018-12-31 DIAGNOSIS — F418 Other specified anxiety disorders: Secondary | ICD-10-CM

## 2019-01-21 ENCOUNTER — Ambulatory Visit (INDEPENDENT_AMBULATORY_CARE_PROVIDER_SITE_OTHER): Payer: BC Managed Care – PPO | Admitting: Psychology

## 2019-01-21 DIAGNOSIS — F418 Other specified anxiety disorders: Secondary | ICD-10-CM | POA: Diagnosis not present

## 2019-01-21 DIAGNOSIS — F3289 Other specified depressive episodes: Secondary | ICD-10-CM | POA: Diagnosis not present

## 2019-02-17 ENCOUNTER — Ambulatory Visit (INDEPENDENT_AMBULATORY_CARE_PROVIDER_SITE_OTHER): Payer: BC Managed Care – PPO | Admitting: Psychology

## 2019-02-17 DIAGNOSIS — F3289 Other specified depressive episodes: Secondary | ICD-10-CM | POA: Diagnosis not present

## 2019-02-17 DIAGNOSIS — F418 Other specified anxiety disorders: Secondary | ICD-10-CM

## 2019-02-18 ENCOUNTER — Other Ambulatory Visit: Payer: Self-pay

## 2019-02-18 ENCOUNTER — Ambulatory Visit: Payer: Self-pay

## 2019-02-18 DIAGNOSIS — Z23 Encounter for immunization: Secondary | ICD-10-CM

## 2019-03-10 ENCOUNTER — Ambulatory Visit (INDEPENDENT_AMBULATORY_CARE_PROVIDER_SITE_OTHER): Payer: BC Managed Care – PPO | Admitting: Psychology

## 2019-03-10 DIAGNOSIS — F419 Anxiety disorder, unspecified: Secondary | ICD-10-CM

## 2019-03-10 DIAGNOSIS — F3289 Other specified depressive episodes: Secondary | ICD-10-CM | POA: Diagnosis not present

## 2019-04-01 ENCOUNTER — Ambulatory Visit (INDEPENDENT_AMBULATORY_CARE_PROVIDER_SITE_OTHER): Payer: BC Managed Care – PPO | Admitting: Psychology

## 2019-04-01 DIAGNOSIS — F418 Other specified anxiety disorders: Secondary | ICD-10-CM | POA: Diagnosis not present

## 2019-04-01 DIAGNOSIS — F3289 Other specified depressive episodes: Secondary | ICD-10-CM

## 2019-04-06 ENCOUNTER — Other Ambulatory Visit: Payer: Self-pay | Admitting: Internal Medicine

## 2019-04-06 DIAGNOSIS — E039 Hypothyroidism, unspecified: Secondary | ICD-10-CM

## 2019-04-10 ENCOUNTER — Other Ambulatory Visit: Payer: Self-pay | Admitting: Internal Medicine

## 2019-04-10 DIAGNOSIS — Z1231 Encounter for screening mammogram for malignant neoplasm of breast: Secondary | ICD-10-CM

## 2019-05-04 ENCOUNTER — Ambulatory Visit
Admission: RE | Admit: 2019-05-04 | Discharge: 2019-05-04 | Disposition: A | Discharge status: Home / Self Care | Payer: BC Managed Care – PPO | Source: Ambulatory Visit | Attending: Internal Medicine | Admitting: Internal Medicine

## 2019-05-04 DIAGNOSIS — Z1231 Encounter for screening mammogram for malignant neoplasm of breast: Secondary | ICD-10-CM

## 2019-05-05 ENCOUNTER — Other Ambulatory Visit: Payer: Self-pay | Admitting: Internal Medicine

## 2019-05-05 ENCOUNTER — Encounter: Payer: Self-pay | Admitting: Internal Medicine

## 2019-05-06 ENCOUNTER — Ambulatory Visit (INDEPENDENT_AMBULATORY_CARE_PROVIDER_SITE_OTHER): Payer: BC Managed Care – PPO | Admitting: Psychology

## 2019-05-06 DIAGNOSIS — F418 Other specified anxiety disorders: Secondary | ICD-10-CM

## 2019-05-06 DIAGNOSIS — F3289 Other specified depressive episodes: Secondary | ICD-10-CM

## 2019-05-28 ENCOUNTER — Ambulatory Visit (INDEPENDENT_AMBULATORY_CARE_PROVIDER_SITE_OTHER): Payer: BC Managed Care – PPO | Admitting: Psychology

## 2019-05-28 DIAGNOSIS — F418 Other specified anxiety disorders: Secondary | ICD-10-CM | POA: Diagnosis not present

## 2019-05-28 DIAGNOSIS — F3289 Other specified depressive episodes: Secondary | ICD-10-CM | POA: Diagnosis not present

## 2019-06-23 ENCOUNTER — Ambulatory Visit (INDEPENDENT_AMBULATORY_CARE_PROVIDER_SITE_OTHER): Payer: BC Managed Care – PPO | Admitting: Psychology

## 2019-06-23 DIAGNOSIS — F418 Other specified anxiety disorders: Secondary | ICD-10-CM

## 2019-06-23 DIAGNOSIS — F3289 Other specified depressive episodes: Secondary | ICD-10-CM

## 2019-07-02 ENCOUNTER — Other Ambulatory Visit: Payer: Self-pay | Admitting: Internal Medicine

## 2019-07-02 DIAGNOSIS — E039 Hypothyroidism, unspecified: Secondary | ICD-10-CM

## 2019-07-02 IMAGING — US US BREAST*L* LIMITED INC AXILLA
1 series · 9 of 9 positions shown · non-contrast
Comparison: Previous exam(s).

CLINICAL DATA: Six-month follow-up for probably benign left breast
masses.

EXAM:
2D DIGITAL DIAGNOSTIC BILATERAL MAMMOGRAM WITH CAD AND ADJUNCT TOMO
LEFT BREAST ULTRASOUND

[Series 1: us breast*left* limited inc axilla · 0.06mm/px · 9 of 9 slices shown]
[im 1/9]
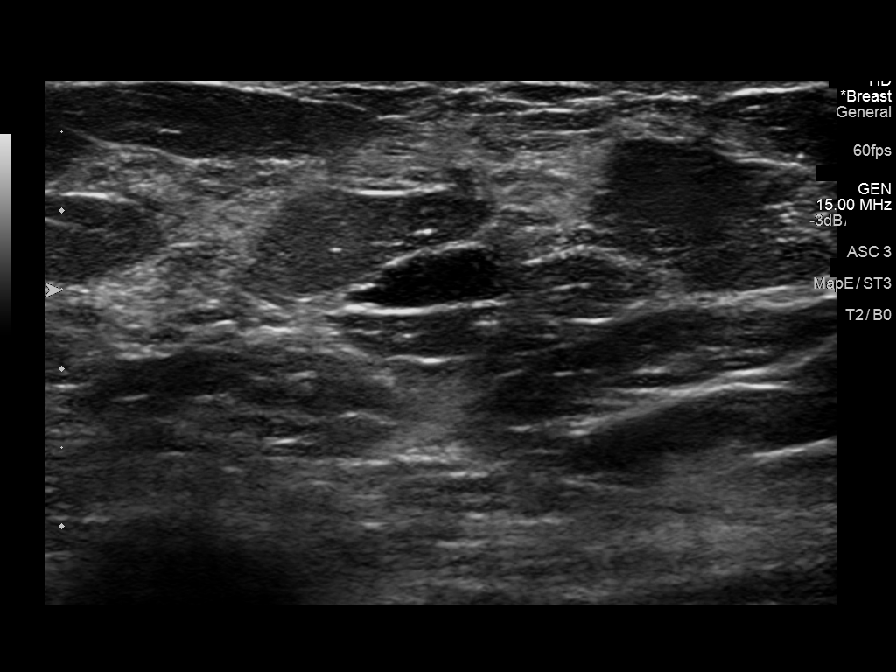
[im 2/9]
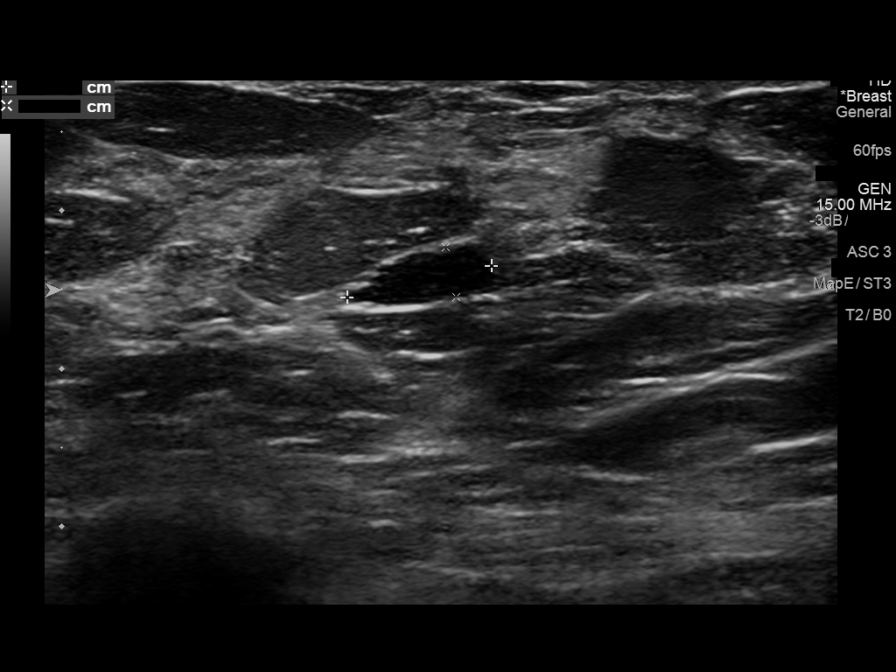
[im 3/9]
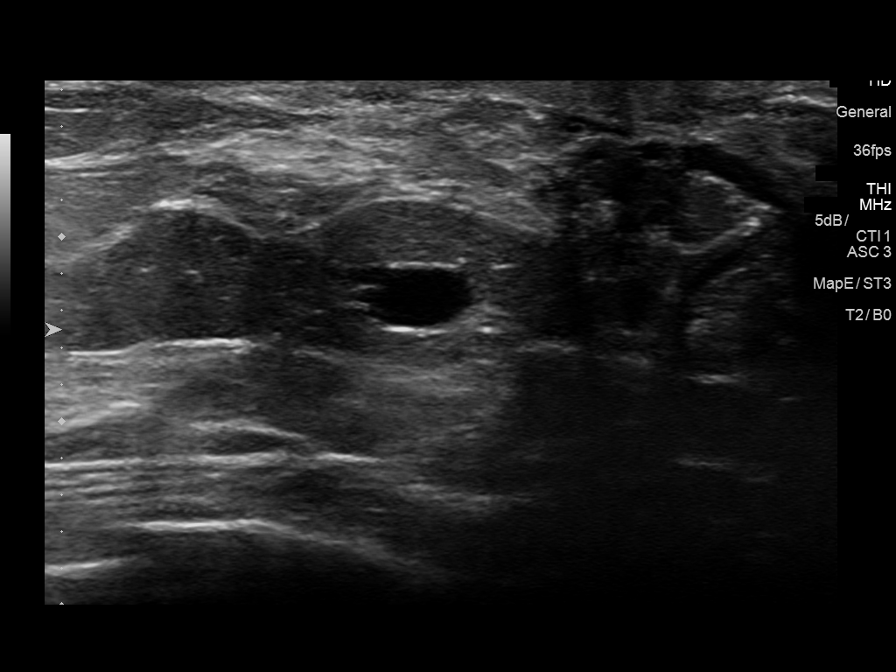
[im 4/9]
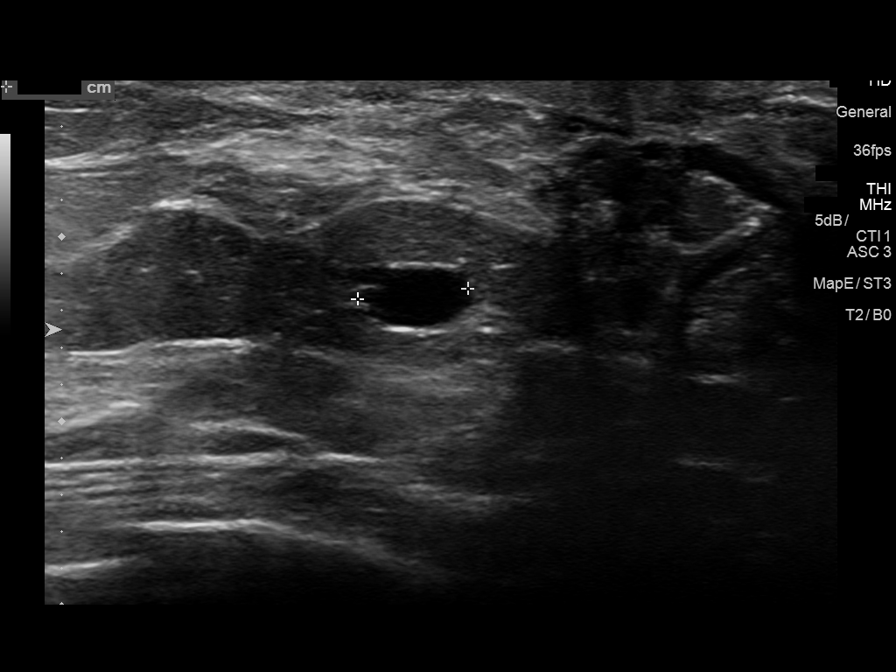
[im 5/9]
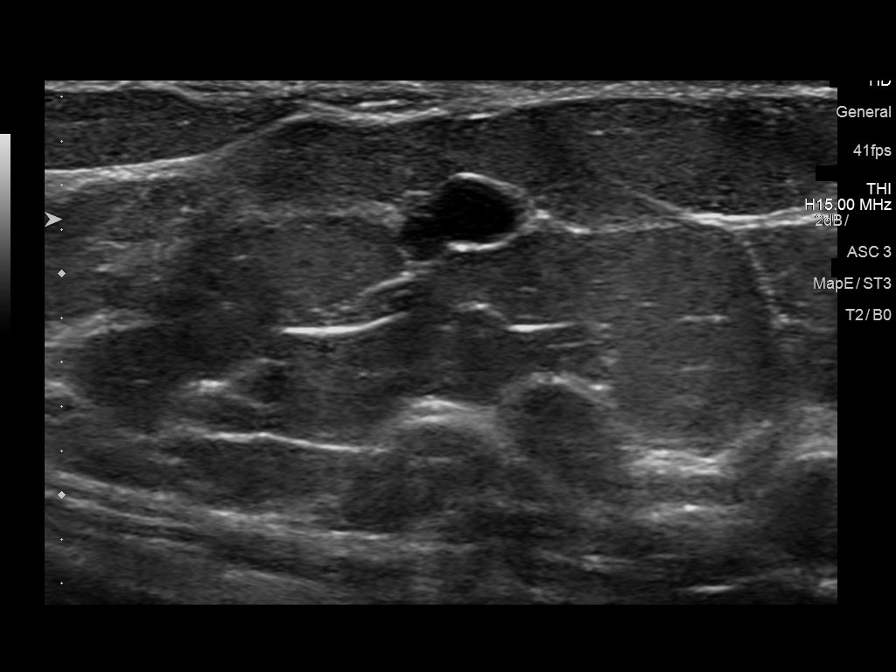
[im 6/9]
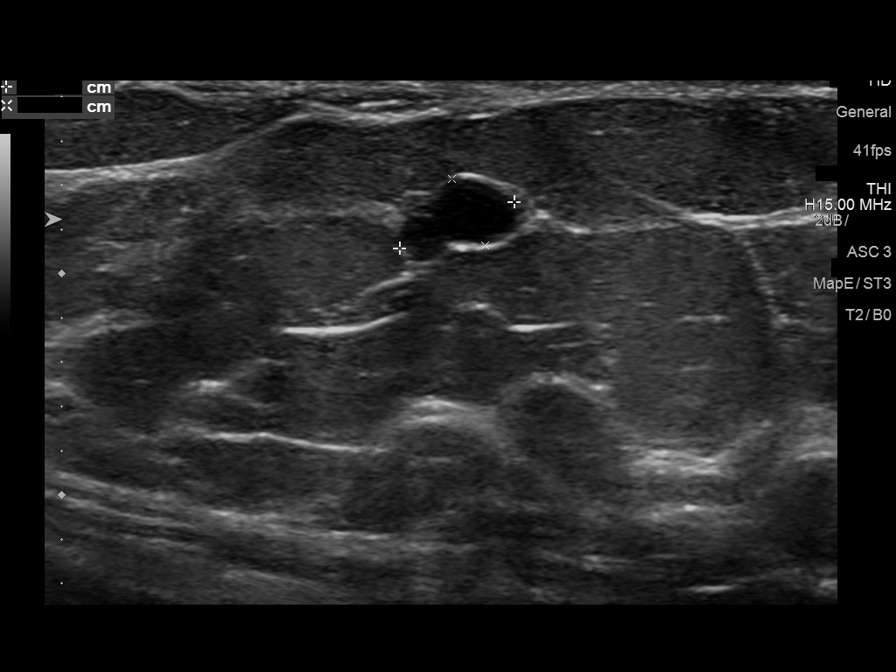
[im 7/9]
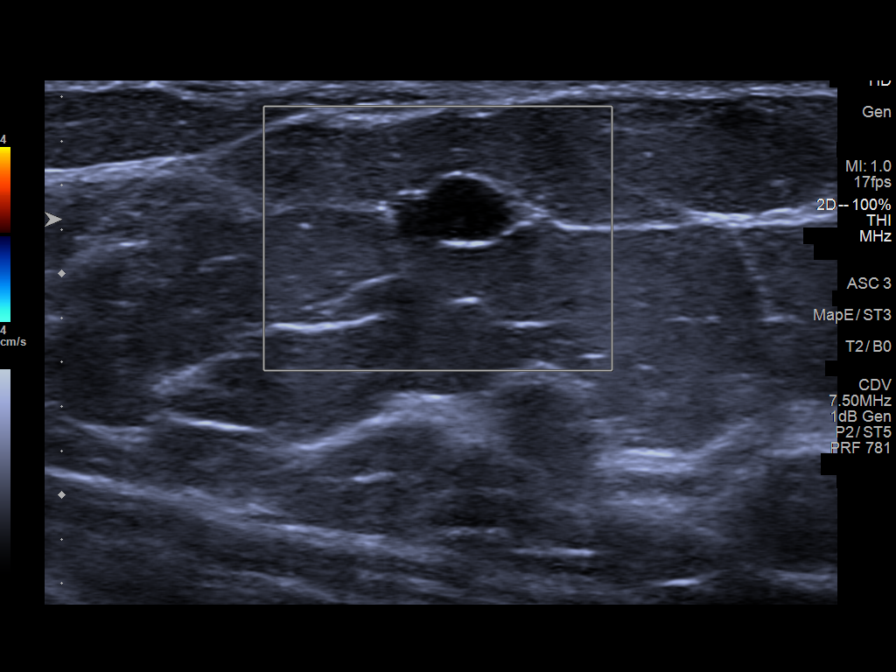
[im 8/9]
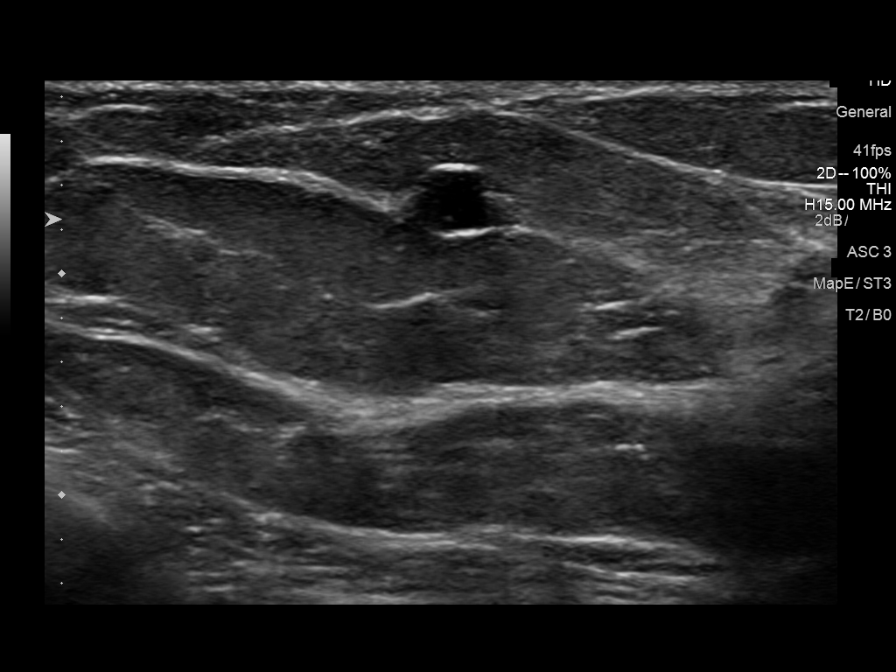
[im 9/9]
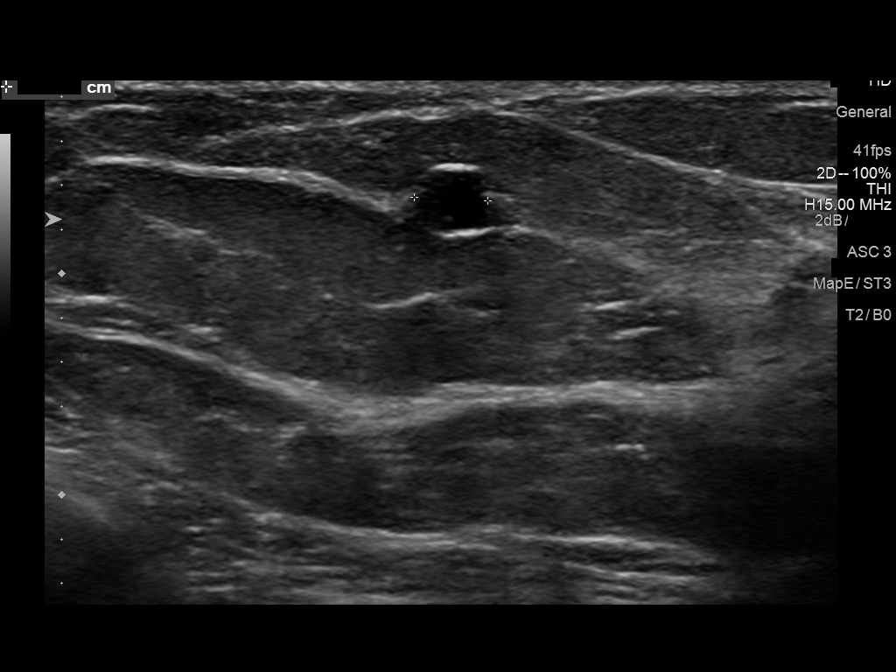

[9 of 9 positions shown; findings below may reference images not displayed]

ACR Breast Density Category d: The breast tissue is extremely dense,
which lowers the sensitivity of mammography.
FINDINGS: These small mass previously seen in the medial aspect of the left
breast has decreased in size. No suspicious calcifications, masses
or areas of distortion are seen in the bilateral breasts.

Mammographic images were processed with CAD.

The mass in the left breast at 9 o'clock, 1 cm from the nipple
measures0 x 3 x 6 mm, and is now completely anechoic compatible with
a benign cyst. The second cyst at this site (previously oval 9
o'clock, 1 cm from the nipple, however re- labeled today at 10
o'clock) measuring 6 x 3 x 3 mm, and is also anechoic, consistent
with a benign cyst.
IMPRESSION: 1.  The 2 masses in the left breast correspond with benign cysts.

2.  No mammographic evidence of malignancy in the bilateral breasts.

RECOMMENDATION:
Screening mammogram in one year.(Code:65-G-LEL)

I have discussed the findings and recommendations with the patient.
Results were also provided in writing at the conclusion of the
visit. If applicable, a reminder letter will be sent to the patient
regarding the next appointment.

BI-RADS CATEGORY  2: Benign.

## 2019-07-10 DIAGNOSIS — Z20828 Contact with and (suspected) exposure to other viral communicable diseases: Secondary | ICD-10-CM | POA: Diagnosis not present

## 2019-07-22 ENCOUNTER — Ambulatory Visit (INDEPENDENT_AMBULATORY_CARE_PROVIDER_SITE_OTHER): Payer: BC Managed Care – PPO | Admitting: Psychology

## 2019-07-22 DIAGNOSIS — F418 Other specified anxiety disorders: Secondary | ICD-10-CM | POA: Diagnosis not present

## 2019-07-22 DIAGNOSIS — F3289 Other specified depressive episodes: Secondary | ICD-10-CM | POA: Diagnosis not present

## 2019-08-01 ENCOUNTER — Other Ambulatory Visit: Payer: Self-pay | Admitting: Internal Medicine

## 2019-08-03 MED ORDER — ESCITALOPRAM OXALATE 10 MG PO TABS: 10.0000 mg | ORAL_TABLET | Freq: Every day | ORAL | 0 refills | Status: DC

## 2019-08-19 ENCOUNTER — Ambulatory Visit (INDEPENDENT_AMBULATORY_CARE_PROVIDER_SITE_OTHER): Payer: BC Managed Care – PPO | Admitting: Psychology

## 2019-08-19 DIAGNOSIS — F3289 Other specified depressive episodes: Secondary | ICD-10-CM | POA: Diagnosis not present

## 2019-08-19 DIAGNOSIS — F418 Other specified anxiety disorders: Secondary | ICD-10-CM | POA: Diagnosis not present

## 2019-09-09 ENCOUNTER — Other Ambulatory Visit (HOSPITAL_COMMUNITY)
Admission: RE | Admit: 2019-09-09 | Discharge: 2019-09-09 | Disposition: A | Discharge status: Home / Self Care | Payer: BC Managed Care – PPO | Source: Ambulatory Visit | Attending: Internal Medicine | Admitting: Internal Medicine

## 2019-09-09 ENCOUNTER — Encounter: Payer: Self-pay | Admitting: Internal Medicine

## 2019-09-09 ENCOUNTER — Ambulatory Visit (INDEPENDENT_AMBULATORY_CARE_PROVIDER_SITE_OTHER): Payer: BC Managed Care – PPO | Admitting: Internal Medicine

## 2019-09-09 ENCOUNTER — Other Ambulatory Visit: Payer: Self-pay

## 2019-09-09 VITALS — BP 118/76 | HR 78 | Temp 98.5°F | Ht 62.0 in | Wt 135.0 lb

## 2019-09-09 DIAGNOSIS — Z0000 Encounter for general adult medical examination without abnormal findings: Secondary | ICD-10-CM

## 2019-09-09 DIAGNOSIS — F419 Anxiety disorder, unspecified: Secondary | ICD-10-CM | POA: Diagnosis not present

## 2019-09-09 DIAGNOSIS — Z124 Encounter for screening for malignant neoplasm of cervix: Secondary | ICD-10-CM

## 2019-09-09 DIAGNOSIS — F32A Depression, unspecified: Secondary | ICD-10-CM

## 2019-09-09 DIAGNOSIS — F329 Major depressive disorder, single episode, unspecified: Secondary | ICD-10-CM

## 2019-09-09 DIAGNOSIS — Z1211 Encounter for screening for malignant neoplasm of colon: Secondary | ICD-10-CM

## 2019-09-09 DIAGNOSIS — E039 Hypothyroidism, unspecified: Secondary | ICD-10-CM | POA: Diagnosis not present

## 2019-09-09 DIAGNOSIS — G43019 Migraine without aura, intractable, without status migrainosus: Secondary | ICD-10-CM | POA: Diagnosis not present

## 2019-09-09 NOTE — Assessment & Plan Note (Signed)
TSH and free T4 today Will adjust Levothyroxine if needed

## 2019-09-09 NOTE — Assessment & Plan Note (Signed)
Stable on Escitalopram Continue therapy

## 2019-09-09 NOTE — Assessment & Plan Note (Signed)
Continue Ibuprofen and Caffeine Will monitor

## 2019-09-09 NOTE — Progress Notes (Signed)
Subjective:    Patient ID: Rachael Holloway, female    DOB: Sep 29, 1975, 44 y.o.   MRN: [id number]  HPI  Patient presents to the clinic today for her annual exam.  She is also due to follow-up chronic conditions.  Anxiety and Depression: Chronic dysthymia with intermittent anxiety, stable on Escitalopram. She is seeing a therapist. She denies SI/HI.  Migraines: These occur once in the last year.  She takes Ibuprofen and caffeine with good relief of symptoms.  She also gets relief from cool compresses when laying down.  Hypothyroidism: She denies any issues on her current dose of Levothyroxine.   Flu: 01/2019 Tetanus: 04/2010 Covid: 07/2019, 08/2019 Pap Smear: 03/2017 Mammogram: 04/2019 Colon Screening: never Vision Screening: annually Dentist: biannually  Diet: She does eat meat. She consumes fruits and veggies daily. She tries to avoid fried foods. She drinks mostly tea, decaf tea, water. Exercise: None      Past Medical History:  Diagnosis Date  . Allergy   . Chicken pox   . Depression   . Migraines   . Thyroid disease   . White coat hypertension     Current Outpatient Medications  Medication Sig Dispense Refill  . Cholecalciferol (VITAMIN D3) 2000 UNITS TABS Take 2 capsules by mouth. 2 times a week    . DiphenhydrAMINE HCl (BENADRYL ALLERGY PO) Take 1 capsule by mouth as needed.    Marland Kitchen escitalopram (LEXAPRO) 10 MG tablet Take 1 tablet (10 mg total) by mouth daily. 90 tablet 0  . Flaxseed, Linseed, (FLAXSEED OIL PO) Take 1 capsule by mouth 2 (two) times daily.    Derald Macleod Factor (INTRINSI B12-FOLATE) [phone] MCG-MCG-MG TABS Take 1 capsule by mouth daily.    Marland Kitchen ibuprofen (ADVIL,MOTRIN) 400 MG tablet Take 400 mg by mouth as needed.    Marland Kitchen levothyroxine (SYNTHROID) 100 MCG tablet TAKE 1 TABLET (100 MCG TOTAL) BY MOUTH DAILY. SCHEDULE PHYSICAL FOR JAN 2021 90 tablet 0  . loratadine (CLARITIN) 10 MG tablet Take 10 mg by mouth daily.    Marland Kitchen  NIACINAMIDE-ZINC-COPPER-FA PO Take 1 capsule by mouth daily.     No current facility-administered medications for this visit.    Allergies  Allergen Reactions  . Dust Mite Mixed Allergen Ext [Mite (D. Farinae)]   . Mold Extract [Trichophyton]   . Pollen Extract     Family History  Problem Relation Age of Onset  . Heart disease Father   . Hypertension Father   . Stroke Maternal Grandmother   . Stroke Paternal Grandfather   . Breast cancer Cousin   . Cancer Neg Hx   . Diabetes Neg Hx     Social History   Socioeconomic History  . Marital status: Married    Spouse name: Not on file  . Number of children: Not on file  . Years of education: Not on file  . Highest education level: Not on file  Occupational History  . Not on file  Tobacco Use  . Smoking status: Never Smoker  . Smokeless tobacco: Never Used  Substance and Sexual Activity  . Alcohol use: Yes    Alcohol/week: 5.0 standard drinks    Types: 5 Glasses of wine per week    Comment: moderate  . Drug use: No  . Sexual activity: Yes  Other Topics Concern  . Not on file  Social History Narrative  . Not on file   Social Determinants of Health   Financial Resource Strain:   . Difficulty of Paying Living  Expenses:   Food Insecurity:   . Worried About Charity fundraiser in the Last Year:   . Arboriculturist in the Last Year:   Transportation Needs:   . Film/video editor (Medical):   Marland Kitchen Lack of Transportation (Non-Medical):   Physical Activity:   . Days of Exercise per Week:   . Minutes of Exercise per Session:   Stress:   . Feeling of Stress :   Social Connections:   . Frequency of Communication with Friends and Family:   . Frequency of Social Gatherings with Friends and Family:   . Attends Religious Services:   . Active Member of Clubs or Organizations:   . Attends Archivist Meetings:   Marland Kitchen Marital Status:   Intimate Partner Violence:   . Fear of Current or Ex-Partner:   . Emotionally  Abused:   Marland Kitchen Physically Abused:   . Sexually Abused:      Constitutional: Pt reports intermittent headaches. Denies fever, malaise, fatigue, or abrupt weight changes.  HEENT: Denies eye pain, eye redness, ear pain, ringing in the ears, wax buildup, runny nose, nasal congestion, bloody nose, or sore throat. Respiratory: Denies difficulty breathing, shortness of breath, cough or sputum production.   Cardiovascular: Denies chest pain, chest tightness, palpitations or swelling in the hands or feet.  Gastrointestinal: Denies abdominal pain, bloating, constipation, diarrhea or blood in the stool.  GU: Denies urgency, frequency, pain with urination, burning sensation, blood in urine, odor or discharge. Musculoskeletal: Denies decrease in range of motion, difficulty with gait, muscle pain or joint pain and swelling.  Skin: Denies redness, rashes, lesions or ulcercations.  Neurological: Denies dizziness, difficulty with memory, difficulty with speech or problems with balance and coordination.  Psych: Pt has a history of anxiety and depression. Denies SI/HI.  No other specific complaints in a complete review of systems (except as listed in HPI above).  Objective:   Physical Exam  BP 118/76   Pulse 78   Temp 98.5 F (36.9 C) (Temporal)   Ht 5' 2"  (1.575 m)   Wt 135 lb (61.2 kg)   LMP 08/12/2019   SpO2 98%   BMI 24.69 kg/m   Wt Readings from Last 3 Encounters:  11/06/18 132 lb (59.9 kg)  06/04/18 127 lb (57.6 kg)  04/10/17 128 lb 12 oz (58.4 kg)    General: Appears her stated age, well developed, well nourished in NAD. Skin: Warm, dry and intact. No rashes noted. HEENT: Head: normal shape and size; Eyes: sclera white, no icterus, conjunctiva pink, PERRLA and EOMs intact;  Neck:  Neck supple, trachea midline. No masses, lumps or thyromegaly present.  Cardiovascular: Normal rate and rhythm. S1,S2 noted.  No murmur, rubs or gallops noted. No JVD or BLE edema.  Pulmonary/Chest: Normal  effort and positive vesicular breath sounds. No respiratory distress. No wheezes, rales or ronchi noted.  Abdomen: Soft and nontender. Normal bowel sounds. No distention or masses noted. Liver, spleen and kidneys non palpable. Pelvic: Normal female anatomy. No changes noted of the cervix. Small amount of yellow/green vaginal discharge or odor present. No CMT, adnexa non palpable. Musculoskeletal: Normal range of motion. Strength 5/5 BUE/BLE. No difficulty with gait.  Neurological: Alert and oriented. Cranial nerves II-XII grossly intact. Coordination normal.  Psychiatric: Mood and affect normal. Behavior is normal. Judgment and thought content normal.     BMET    Component Value Date/Time   NA 142 11/06/2018 1433   K 4.5 11/06/2018 1433  CL 106 11/06/2018 1433   CO2 21 11/06/2018 1433   GLUCOSE 90 11/06/2018 1433   GLUCOSE 95 04/10/2017 1547   BUN 10 11/06/2018 1433   CREATININE 0.83 11/06/2018 1433   CREATININE 0.86 04/10/2017 1547   CALCIUM 9.3 11/06/2018 1433   GFRNONAA 86 11/06/2018 1433   GFRAA 99 11/06/2018 1433    Lipid Panel     Component Value Date/Time   CHOL 219 (H) 06/04/2018 1449   TRIG 176 (H) 06/04/2018 1449   HDL 74 06/04/2018 1449   CHOLHDL 3.0 06/04/2018 1449   CHOLHDL 3.1 04/10/2017 1547   VLDL 26.4 10/07/2014 1052   LDLCALC 110 (H) 06/04/2018 1449   LDLCALC 97 04/10/2017 1547    CBC    Component Value Date/Time   WBC 7.5 06/04/2018 1449   WBC 8.9 04/10/2017 1547   RBC 4.61 06/04/2018 1449   RBC 4.63 04/10/2017 1547   HGB 14.3 06/04/2018 1449   HCT 42.4 06/04/2018 1449   PLT 298 06/04/2018 1449   MCV 92 06/04/2018 1449   MCH 31.0 06/04/2018 1449   MCH 31.1 04/10/2017 1547   MCHC 33.7 06/04/2018 1449   MCHC 34.4 04/10/2017 1547   RDW 11.8 06/04/2018 1449    Hgb A1C No results found for: HGBA1C          Assessment & Plan:   Preventative Health Maintenance:  Encouraged her to get a flu shot in the fall She will get her tetanus  vaccine at next annual exam Covid UTD Pap smear today, she declines STD screening She will call to schedule her mammogram Referral to GI for screening colonoscopy Encouraged her to consume a balanced diet and exercise regimen Advised her to see an eye doctor and dentist annually We will check CBC, C met, lipid and vitamin D today  RTC in 1 year, sooner if needed Webb Silversmith, NP This visit occurred during the SARS-CoV-2 public health emergency.  Safety protocols were in place, including screening questions prior to the visit, additional usage of staff PPE, and extensive cleaning of exam room while observing appropriate contact time as indicated for disinfecting solutions.

## 2019-09-09 NOTE — Patient Instructions (Signed)
Health Maintenance, Female Adopting a healthy lifestyle and getting preventive care are important in promoting health and wellness. Ask your health care provider about:  The right schedule for you to have regular tests and exams.  Things you can do on your own to prevent diseases and keep yourself healthy. What should I know about diet, weight, and exercise? Eat a healthy diet   Eat a diet that includes plenty of vegetables, fruits, low-fat dairy products, and lean protein.  Do not eat a lot of foods that are high in solid fats, added sugars, or sodium. Maintain a healthy weight Body mass index (BMI) is used to identify weight problems. It estimates body fat based on height and weight. Your health care provider can help determine your BMI and help you achieve or maintain a healthy weight. Get regular exercise Get regular exercise. This is one of the most important things you can do for your health. Most adults should:  Exercise for at least 150 minutes each week. The exercise should increase your heart rate and make you sweat (moderate-intensity exercise).  Do strengthening exercises at least twice a week. This is in addition to the moderate-intensity exercise.  Spend less time sitting. Even light physical activity can be beneficial. Watch cholesterol and blood lipids Have your blood tested for lipids and cholesterol at 46 years of age, then have this test every 5 years. Have your cholesterol levels checked more often if:  Your lipid or cholesterol levels are high.  You are older than 46 years of age.  You are at high risk for heart disease. What should I know about cancer screening? Depending on your health history and family history, you may need to have cancer screening at various ages. This may include screening for:  Breast cancer.  Cervical cancer.  Colorectal cancer.  Skin cancer.  Lung cancer. What should I know about heart disease, diabetes, and high blood  pressure? Blood pressure and heart disease  High blood pressure causes heart disease and increases the risk of stroke. This is more likely to develop in people who have high blood pressure readings, are of African descent, or are overweight.  Have your blood pressure checked: ? Every 3-5 years if you are 18-39 years of age. ? Every year if you are 40 years old or older. Diabetes Have regular diabetes screenings. This checks your fasting blood sugar level. Have the screening done:  Once every three years after age 40 if you are at a normal weight and have a low risk for diabetes.  More often and at a younger age if you are overweight or have a high risk for diabetes. What should I know about preventing infection? Hepatitis B If you have a higher risk for hepatitis B, you should be screened for this virus. Talk with your health care provider to find out if you are at risk for hepatitis B infection. Hepatitis C Testing is recommended for:  Everyone born from 1945 through 1965.  Anyone with known risk factors for hepatitis C. Sexually transmitted infections (STIs)  Get screened for STIs, including gonorrhea and chlamydia, if: ? You are sexually active and are younger than 46 years of age. ? You are older than 46 years of age and your health care provider tells you that you are at risk for this type of infection. ? Your sexual activity has changed since you were last screened, and you are at increased risk for chlamydia or gonorrhea. Ask your health care provider if   you are at risk.  Ask your health care provider about whether you are at high risk for HIV. Your health care provider may recommend a prescription medicine to help prevent HIV infection. If you choose to take medicine to prevent HIV, you should first get tested for HIV. You should then be tested every 3 months for as long as you are taking the medicine. Pregnancy  If you are about to stop having your period (premenopausal) and  you may become pregnant, seek counseling before you get pregnant.  Take 400 to 800 micrograms (mcg) of folic acid every day if you become pregnant.  Ask for birth control (contraception) if you want to prevent pregnancy. Osteoporosis and menopause Osteoporosis is a disease in which the bones lose minerals and strength with aging. This can result in bone fractures. If you are 65 years old or older, or if you are at risk for osteoporosis and fractures, ask your health care provider if you should:  Be screened for bone loss.  Take a calcium or vitamin D supplement to lower your risk of fractures.  Be given hormone replacement therapy (HRT) to treat symptoms of menopause. Follow these instructions at home: Lifestyle  Do not use any products that contain nicotine or tobacco, such as cigarettes, e-cigarettes, and chewing tobacco. If you need help quitting, ask your health care provider.  Do not use street drugs.  Do not share needles.  Ask your health care provider for help if you need support or information about quitting drugs. Alcohol use  Do not drink alcohol if: ? Your health care provider tells you not to drink. ? You are pregnant, may be pregnant, or are planning to become pregnant.  If you drink alcohol: ? Limit how much you use to 0-1 drink a day. ? Limit intake if you are breastfeeding.  Be aware of how much alcohol is in your drink. In the [place] one drink equals one 12 oz bottle of beer (355 mL), one 5 oz glass of wine (148 mL), or one 1 oz glass of hard liquor (44 mL). General instructions  Schedule regular health, dental, and eye exams.  Stay current with your vaccines.  Tell your health care provider if: ? You often feel depressed. ? You have ever been abused or do not feel safe at home. Summary  Adopting a healthy lifestyle and getting preventive care are important in promoting health and wellness.  Follow your health care provider's instructions about healthy  diet, exercising, and getting tested or screened for diseases.  Follow your health care provider's instructions on monitoring your cholesterol and blood pressure. This information is not intended to replace advice given to you by your health care provider. Make sure you discuss any questions you have with your health care provider. Document Revised: 04/30/2018 Document Reviewed: 04/30/2018 Elsevier Patient Education  2020 Elsevier Inc.  

## 2019-09-10 ENCOUNTER — Telehealth: Payer: Self-pay

## 2019-09-10 LAB — LDL CHOLESTEROL, DIRECT: Direct LDL: 150 mg/dL

## 2019-09-10 LAB — CBC
HCT: 43.2 % (ref 36.0–46.0)
Hemoglobin: 14.7 g/dL (ref 12.0–15.0)
MCHC: 34 g/dL (ref 30.0–36.0)
MCV: 94.7 fl (ref 78.0–100.0)
Platelets: 283 10*3/uL (ref 150.0–400.0)
RBC: 4.56 Mil/uL (ref 3.87–5.11)
RDW: 12.7 % (ref 11.5–15.5)
WBC: 7.6 10*3/uL (ref 4.0–10.5)

## 2019-09-10 LAB — T4, FREE: Free T4: 0.84 ng/dL (ref 0.60–1.60)

## 2019-09-10 LAB — TSH: TSH: 7.16 u[IU]/mL — ABNORMAL HIGH (ref 0.35–4.50)

## 2019-09-10 LAB — COMPREHENSIVE METABOLIC PANEL
ALT: 11 U/L (ref 0–35)
AST: 17 U/L (ref 0–37)
Albumin: 4.3 g/dL (ref 3.5–5.2)
Alkaline Phosphatase: 62 U/L (ref 39–117)
BUN: 15 mg/dL (ref 6–23)
CO2: 30 mEq/L (ref 19–32)
Calcium: 9.6 mg/dL (ref 8.4–10.5)
Chloride: 100 mEq/L (ref 96–112)
Creatinine, Ser: 0.76 mg/dL (ref 0.40–1.20)
GFR: 82.05 mL/min (ref >60.00)
Glucose, Bld: 86 mg/dL (ref 70–99)
Potassium: 4.5 mEq/L (ref 3.5–5.1)
Sodium: 137 mEq/L (ref 135–145)
Total Bilirubin: 0.4 mg/dL (ref 0.2–1.2)
Total Protein: 6.5 g/dL (ref 6.0–8.3)

## 2019-09-10 LAB — LIPID PANEL
Cholesterol: 250 mg/dL — ABNORMAL HIGH (ref 0–200)
HDL: 64.9 mg/dL (ref >39.00)
NonHDL: 185.49
Total CHOL/HDL Ratio: 4
Triglycerides: 321 mg/dL — ABNORMAL HIGH (ref 0.0–149.0)
VLDL: 64.2 mg/dL — ABNORMAL HIGH (ref 0.0–40.0)

## 2019-09-10 LAB — VITAMIN D 25 HYDROXY (VIT D DEFICIENCY, FRACTURES): VITD: 45.13 ng/mL (ref 30.00–100.00)

## 2019-09-10 NOTE — Telephone Encounter (Signed)
Left message for patient to call back to ask location preference for referral to GI

## 2019-09-11 ENCOUNTER — Encounter: Payer: Self-pay | Admitting: Internal Medicine

## 2019-09-11 DIAGNOSIS — E782 Mixed hyperlipidemia: Secondary | ICD-10-CM

## 2019-09-11 DIAGNOSIS — E039 Hypothyroidism, unspecified: Secondary | ICD-10-CM

## 2019-09-11 MED ORDER — LEVOTHYROXINE SODIUM 112 MCG PO TABS: 112.0000 ug | ORAL_TABLET | Freq: Every day | ORAL | 0 refills | Status: DC

## 2019-09-11 NOTE — Telephone Encounter (Signed)
Patient prefers [facility].  Patient said she can go anytime after 09/30/19.

## 2019-09-11 NOTE — Telephone Encounter (Signed)
Noted. Sending referral to [facility] to schedule with patient.

## 2019-09-14 LAB — CYTOLOGY - PAP
Comment: NEGATIVE
Diagnosis: NEGATIVE
High risk HPV: NEGATIVE

## 2019-09-15 ENCOUNTER — Ambulatory Visit (INDEPENDENT_AMBULATORY_CARE_PROVIDER_SITE_OTHER): Payer: BC Managed Care – PPO | Admitting: Psychology

## 2019-09-15 DIAGNOSIS — F3289 Other specified depressive episodes: Secondary | ICD-10-CM

## 2019-09-15 DIAGNOSIS — F418 Other specified anxiety disorders: Secondary | ICD-10-CM | POA: Diagnosis not present

## 2019-09-21 ENCOUNTER — Other Ambulatory Visit: Payer: Self-pay

## 2019-09-21 ENCOUNTER — Telehealth (INDEPENDENT_AMBULATORY_CARE_PROVIDER_SITE_OTHER): Payer: Self-pay | Admitting: Gastroenterology

## 2019-09-21 DIAGNOSIS — Z1211 Encounter for screening for malignant neoplasm of colon: Secondary | ICD-10-CM

## 2019-09-21 NOTE — Progress Notes (Signed)
Gastroenterology Pre-Procedure Review  Request Date: July 9th Requesting Physician: Dr. Vicente Males  PATIENT REVIEW QUESTIONS: The patient responded to the following health history questions as indicated:    1. Are you having any GI issues? no 2. Do you have a personal history of Polyps? no 3. Do you have a family history of Colon Cancer or Polyps? no 4. Diabetes Mellitus? no 5. Joint replacements in the past 12 months?no 6. Major health problems in the past 3 months?no 7. Any artificial heart valves, MVP, or defibrillator?no    MEDICATIONS & ALLERGIES:    Patient reports the following regarding taking any anticoagulation/antiplatelet therapy:   Plavix, Coumadin, Eliquis, Xarelto, Lovenox, Pradaxa, Brilinta, or Effient? no Aspirin? no  Patient confirms/reports the following medications:  Current Outpatient Medications  Medication Sig Dispense Refill  . Cholecalciferol (VITAMIN D3) 2000 UNITS TABS Take 2 capsules by mouth. 2 times a week    . escitalopram (LEXAPRO) 10 MG tablet Take 1 tablet (10 mg total) by mouth daily. 90 tablet 0  . Flaxseed, Linseed, (FLAXSEED OIL PO) Take 1 capsule by mouth 2 (two) times daily.    Derald Macleod Factor (INTRINSI B12-FOLATE) [id number] MCG-MCG-MG TABS Take 1 capsule by mouth daily.    Marland Kitchen levothyroxine (SYNTHROID) 112 MCG tablet Take 1 tablet (112 mcg total) by mouth daily. 30 tablet 0  . loratadine (CLARITIN) 10 MG tablet Take 10 mg by mouth daily.    Marland Kitchen NIACINAMIDE-ZINC-COPPER-FA PO Take 1 capsule by mouth daily.    . DiphenhydrAMINE HCl (BENADRYL ALLERGY PO) Take 1 capsule by mouth as needed.    Marland Kitchen ibuprofen (ADVIL,MOTRIN) 400 MG tablet Take 400 mg by mouth as needed.     No current facility-administered medications for this visit.    Patient confirms/reports the following allergies:  Allergies  Allergen Reactions  . Dust Mite Mixed Allergen Ext [Mite (D. Farinae)]   . Mold Extract [Trichophyton]   . Pollen Extract     No orders of the  defined types were placed in this encounter.   AUTHORIZATION INFORMATION Primary Insurance: 1D#: Group #:  Secondary Insurance: 1D#: Group #:  SCHEDULE INFORMATION: Date: Friday 11/27/19 Time: Location:[facility]

## 2019-09-26 ENCOUNTER — Other Ambulatory Visit: Payer: Self-pay | Admitting: Internal Medicine

## 2019-09-26 DIAGNOSIS — E039 Hypothyroidism, unspecified: Secondary | ICD-10-CM

## 2019-10-04 ENCOUNTER — Other Ambulatory Visit: Payer: Self-pay | Admitting: Internal Medicine

## 2019-10-04 DIAGNOSIS — E039 Hypothyroidism, unspecified: Secondary | ICD-10-CM

## 2019-10-07 ENCOUNTER — Encounter: Payer: Self-pay | Admitting: Internal Medicine

## 2019-10-08 ENCOUNTER — Other Ambulatory Visit (INDEPENDENT_AMBULATORY_CARE_PROVIDER_SITE_OTHER): Payer: BC Managed Care – PPO

## 2019-10-08 DIAGNOSIS — E039 Hypothyroidism, unspecified: Secondary | ICD-10-CM | POA: Diagnosis not present

## 2019-10-08 DIAGNOSIS — E782 Mixed hyperlipidemia: Secondary | ICD-10-CM | POA: Diagnosis not present

## 2019-10-08 LAB — LIPID PANEL
Cholesterol: 204 mg/dL — ABNORMAL HIGH (ref 0–200)
HDL: 66.1 mg/dL (ref >39.00)
LDL Cholesterol: 104 mg/dL — ABNORMAL HIGH (ref 0–99)
NonHDL: 138.34
Total CHOL/HDL Ratio: 3
Triglycerides: 173 mg/dL — ABNORMAL HIGH (ref 0.0–149.0)
VLDL: 34.6 mg/dL (ref 0.0–40.0)

## 2019-10-08 LAB — TSH: TSH: 5.06 u[IU]/mL — ABNORMAL HIGH (ref 0.35–4.50)

## 2019-10-09 ENCOUNTER — Encounter: Payer: Self-pay | Admitting: Internal Medicine

## 2019-10-09 DIAGNOSIS — E039 Hypothyroidism, unspecified: Secondary | ICD-10-CM

## 2019-10-12 MED ORDER — LEVOTHYROXINE SODIUM 112 MCG PO TABS: 112.0000 ug | ORAL_TABLET | Freq: Every day | ORAL | 0 refills | Status: DC

## 2019-10-21 ENCOUNTER — Encounter: Payer: Self-pay | Admitting: Internal Medicine

## 2019-10-22 ENCOUNTER — Ambulatory Visit (INDEPENDENT_AMBULATORY_CARE_PROVIDER_SITE_OTHER): Payer: BC Managed Care – PPO | Admitting: Psychology

## 2019-10-22 DIAGNOSIS — F3289 Other specified depressive episodes: Secondary | ICD-10-CM | POA: Diagnosis not present

## 2019-10-22 DIAGNOSIS — F418 Other specified anxiety disorders: Secondary | ICD-10-CM

## 2019-10-26 ENCOUNTER — Other Ambulatory Visit: Payer: Self-pay | Admitting: Internal Medicine

## 2019-11-24 ENCOUNTER — Ambulatory Visit (INDEPENDENT_AMBULATORY_CARE_PROVIDER_SITE_OTHER): Payer: BC Managed Care – PPO | Admitting: Psychology

## 2019-11-24 DIAGNOSIS — F418 Other specified anxiety disorders: Secondary | ICD-10-CM | POA: Diagnosis not present

## 2019-11-24 DIAGNOSIS — F3289 Other specified depressive episodes: Secondary | ICD-10-CM | POA: Diagnosis not present

## 2019-11-25 ENCOUNTER — Other Ambulatory Visit: Payer: Self-pay

## 2019-11-25 ENCOUNTER — Other Ambulatory Visit
Admission: RE | Admit: 2019-11-25 | Discharge: 2019-11-25 | Disposition: A | Discharge status: Home / Self Care | Payer: BC Managed Care – PPO | Source: Ambulatory Visit | Attending: Gastroenterology | Admitting: Gastroenterology

## 2019-11-25 DIAGNOSIS — Z01812 Encounter for preprocedural laboratory examination: Secondary | ICD-10-CM | POA: Insufficient documentation

## 2019-11-25 DIAGNOSIS — Z20822 Contact with and (suspected) exposure to covid-19: Secondary | ICD-10-CM | POA: Insufficient documentation

## 2019-11-25 LAB — SARS CORONAVIRUS 2 (TAT 6-24 HRS): SARS Coronavirus 2: NEGATIVE

## 2019-11-27 ENCOUNTER — Encounter: Payer: Self-pay | Admitting: Gastroenterology

## 2019-11-27 ENCOUNTER — Other Ambulatory Visit: Payer: Self-pay

## 2019-11-27 ENCOUNTER — Ambulatory Visit: Payer: BC Managed Care – PPO | Admitting: Certified Registered Nurse Anesthetist

## 2019-11-27 ENCOUNTER — Ambulatory Visit
Admission: RE | Admit: 2019-11-27 | Discharge: 2019-11-27 | Disposition: A | Discharge status: Home / Self Care | Payer: BC Managed Care – PPO | Attending: Gastroenterology | Admitting: Gastroenterology

## 2019-11-27 ENCOUNTER — Encounter
Admission: RE | Disposition: A | Discharge status: Home / Self Care | Payer: Self-pay | Source: Home / Self Care | Attending: Gastroenterology

## 2019-11-27 DIAGNOSIS — D126 Benign neoplasm of colon, unspecified: Secondary | ICD-10-CM | POA: Diagnosis not present

## 2019-11-27 DIAGNOSIS — K5790 Diverticulosis of intestine, part unspecified, without perforation or abscess without bleeding: Secondary | ICD-10-CM | POA: Diagnosis not present

## 2019-11-27 DIAGNOSIS — E039 Hypothyroidism, unspecified: Secondary | ICD-10-CM | POA: Insufficient documentation

## 2019-11-27 DIAGNOSIS — Z91048 Other nonmedicinal substance allergy status: Secondary | ICD-10-CM | POA: Diagnosis not present

## 2019-11-27 DIAGNOSIS — F419 Anxiety disorder, unspecified: Secondary | ICD-10-CM | POA: Diagnosis not present

## 2019-11-27 DIAGNOSIS — Z1211 Encounter for screening for malignant neoplasm of colon: Secondary | ICD-10-CM

## 2019-11-27 DIAGNOSIS — K5730 Diverticulosis of large intestine without perforation or abscess without bleeding: Secondary | ICD-10-CM | POA: Insufficient documentation

## 2019-11-27 DIAGNOSIS — D125 Benign neoplasm of sigmoid colon: Secondary | ICD-10-CM | POA: Insufficient documentation

## 2019-11-27 DIAGNOSIS — D122 Benign neoplasm of ascending colon: Secondary | ICD-10-CM | POA: Insufficient documentation

## 2019-11-27 DIAGNOSIS — F329 Major depressive disorder, single episode, unspecified: Secondary | ICD-10-CM | POA: Insufficient documentation

## 2019-11-27 DIAGNOSIS — Z79899 Other long term (current) drug therapy: Secondary | ICD-10-CM | POA: Insufficient documentation

## 2019-11-27 DIAGNOSIS — Z79890 Hormone replacement therapy (postmenopausal): Secondary | ICD-10-CM | POA: Diagnosis not present

## 2019-11-27 DIAGNOSIS — K635 Polyp of colon: Secondary | ICD-10-CM | POA: Diagnosis not present

## 2019-11-27 HISTORY — PX: COLONOSCOPY WITH PROPOFOL: SHX5780

## 2019-11-27 LAB — POCT PREGNANCY, URINE: Preg Test, Ur: NEGATIVE

## 2019-11-27 SURGERY — COLONOSCOPY WITH PROPOFOL
Anesthesia: General

## 2019-11-27 MED ORDER — PHENYLEPHRINE HCL (PRESSORS) 10 MG/ML IV SOLN
INTRAVENOUS | Status: AC
  Filled 2019-11-27: qty 1

## 2019-11-27 MED ORDER — PHENYLEPHRINE HCL (PRESSORS) 10 MG/ML IV SOLN
INTRAVENOUS | Status: DC | PRN
  Administered 2019-11-27: 100 ug via INTRAVENOUS
  Administered 2019-11-27 (×2): 50 ug via INTRAVENOUS

## 2019-11-27 MED ORDER — LIDOCAINE HCL (PF) 1 % IJ SOLN
INTRAMUSCULAR | Status: AC
  Administered 2019-11-27: 2 mL
  Filled 2019-11-27: qty 2

## 2019-11-27 MED ORDER — LIDOCAINE HCL (CARDIAC) PF 100 MG/5ML IV SOSY
PREFILLED_SYRINGE | INTRAVENOUS | Status: DC | PRN
  Administered 2019-11-27: 50 mg via INTRAVENOUS

## 2019-11-27 MED ORDER — PROPOFOL 10 MG/ML IV BOLUS
INTRAVENOUS | Status: DC | PRN
  Administered 2019-11-27: 100 mg via INTRAVENOUS

## 2019-11-27 MED ORDER — PROPOFOL 500 MG/50ML IV EMUL
INTRAVENOUS | Status: DC | PRN
  Administered 2019-11-27: 175 ug/kg/min via INTRAVENOUS

## 2019-11-27 MED ORDER — SODIUM CHLORIDE 0.9 % IV SOLN: INTRAVENOUS | Status: DC

## 2019-11-27 MED ORDER — PROPOFOL 500 MG/50ML IV EMUL
INTRAVENOUS | Status: AC
  Filled 2019-11-27: qty 50

## 2019-11-27 MED ORDER — LIDOCAINE HCL (PF) 2 % IJ SOLN
INTRAMUSCULAR | Status: AC
  Filled 2019-11-27: qty 5

## 2019-11-27 NOTE — Anesthesia Preprocedure Evaluation (Signed)
Anesthesia Evaluation  Patient identified by MRN, date of birth, ID band Patient awake    Reviewed: Allergy & Precautions, NPO status , Patient's Chart, lab work & pertinent test results  Airway Mallampati: III       Dental  (+) Teeth Intact   Pulmonary neg pulmonary ROS,    Pulmonary exam normal        Cardiovascular negative cardio ROS Normal cardiovascular exam     Neuro/Psych  Headaches, PSYCHIATRIC DISORDERS Anxiety Depression    GI/Hepatic negative GI ROS, Neg liver ROS,   Endo/Other  Hypothyroidism   Renal/GU negative Renal ROS  negative genitourinary   Musculoskeletal negative musculoskeletal ROS (+)   Abdominal Normal abdominal exam  (+)   Peds negative pediatric ROS (+)  Hematology negative hematology ROS (+)   Anesthesia Other Findings Past Medical History: No date: Allergy No date: Chicken pox No date: Depression No date: Migraines No date: Thyroid disease No date: White coat hypertension  Reproductive/Obstetrics                             Anesthesia Physical Anesthesia Plan  ASA: II  Anesthesia Plan: General   Post-op Pain Management:    Induction: Intravenous  PONV Risk Score and Plan: Propofol infusion  Airway Management Planned: Nasal Cannula  Additional Equipment:   Intra-op Plan:   Post-operative Plan:   Informed Consent: I have reviewed the patients History and Physical, chart, labs and discussed the procedure including the risks, benefits and alternatives for the proposed anesthesia with the patient or authorized representative who has indicated his/her understanding and acceptance.     Dental advisory given  Plan Discussed with: CRNA and Surgeon  Anesthesia Plan Comments:         Anesthesia Quick Evaluation

## 2019-11-27 NOTE — Anesthesia Postprocedure Evaluation (Signed)
Anesthesia Post Note  Patient: Rachael Holloway  Procedure(s) Performed: COLONOSCOPY WITH PROPOFOL (N/A )  Patient location during evaluation: Endoscopy Anesthesia Type: General Level of consciousness: awake and alert and oriented Pain management: pain level controlled Vital Signs Assessment: post-procedure vital signs reviewed and stable Respiratory status: spontaneous breathing Cardiovascular status: blood pressure returned to baseline Anesthetic complications: no   No complications documented.   Last Vitals:  Vitals:   11/27/19 0819 11/27/19 0829  BP: 105/66 116/78  Pulse:    Resp:    Temp:    SpO2:      Last Pain:  Vitals:   11/27/19 0839  TempSrc:   PainSc: 0-No pain                 Jakota Manthei

## 2019-11-27 NOTE — H&P (Signed)
Jonathon Bellows, MD [address], [place], [place], [place], [address] [address], [place], [place], [place], [address] Phone: [phone]  Fax: [phone]  Primary Care Physician:  Jearld Fenton, NP   Pre-Procedure History & Physical: HPI:  Rachael Holloway is a 44 y.o. female is here for an colonoscopy.   Past Medical History:  Diagnosis Date  . Allergy   . Chicken pox   . Depression   . Migraines   . Thyroid disease   . White coat hypertension     Past Surgical History:  Procedure Laterality Date  . EAR TUBE REMOVAL  1985  . WISDOM TOOTH EXTRACTION      Prior to Admission medications   Medication Sig Start Date End Date Taking? Authorizing Provider  Cholecalciferol (VITAMIN D3) 2000 UNITS TABS Take 2 capsules by mouth. 2 times a week   Yes [provider]  escitalopram (LEXAPRO) 10 MG tablet TAKE 1 TABLET BY MOUTH EVERY DAY 10/26/19  Yes Baity, Coralie Keens, NP  Flaxseed, Linseed, (FLAXSEED OIL PO) Take 1 capsule by mouth 2 (two) times daily.   Yes [provider]  Folate-B12-Intrinsic Factor (INTRINSI B12-FOLATE) [id number] MCG-MCG-MG TABS Take 1 capsule by mouth daily.   Yes [provider]  ibuprofen (ADVIL,MOTRIN) 400 MG tablet Take 400 mg by mouth as needed.   Yes [provider]  levothyroxine (SYNTHROID) 112 MCG tablet Take 1 tablet (112 mcg total) by mouth daily. 10/12/19  Yes Jearld Fenton, NP  NIACINAMIDE-ZINC-COPPER-FA PO Take 1 capsule by mouth daily.   Yes [provider]  DiphenhydrAMINE HCl (BENADRYL ALLERGY PO) Take 1 capsule by mouth as needed.    [provider]  loratadine (CLARITIN) 10 MG tablet Take 10 mg by mouth daily.    [provider]    Allergies as of 09/21/2019 - Review Complete 09/21/2019  Allergen Reaction Noted  . Dust mite mixed allergen ext [mite (d. farinae)]  04/10/2017  . Mold extract [trichophyton]  06/25/2014  . Pollen extract  06/25/2014    Family History    Problem Relation Age of Onset  . Heart disease Father   . Hypertension Father   . Stroke Maternal Grandmother   . Stroke Paternal Grandfather   . Breast cancer Cousin   . Cancer Neg Hx   . Diabetes Neg Hx     Social History   Socioeconomic History  . Marital status: Married    Spouse name: Not on file  . Number of children: Not on file  . Years of education: Not on file  . Highest education level: Not on file  Occupational History  . Not on file  Tobacco Use  . Smoking status: Never Smoker  . Smokeless tobacco: Never Used  Vaping Use  . Vaping Use: Never used  Substance and Sexual Activity  . Alcohol use: Yes    Alcohol/week: 5.0 standard drinks    Types: 5 Glasses of wine per week    Comment: moderate  . Drug use: No  . Sexual activity: Yes  Other Topics Concern  . Not on file  Social History Narrative  . Not on file   Social Determinants of Health   Financial Resource Strain:   . Difficulty of Paying Living Expenses:   Food Insecurity:   . Worried About Charity fundraiser in the Last Year:   . [place] in the Last Year:   Transportation Needs:   . Lack of  Transportation (Medical):   Marland Kitchen Lack of Transportation (Non-Medical):   Physical Activity:   . Days of Exercise per Week:   . Minutes of Exercise per Session:   Stress:   . Feeling of Stress :   Social Connections:   . Frequency of Communication with Friends and Family:   . Frequency of Social Gatherings with Friends and Family:   . Attends Religious Services:   . Active Member of Clubs or Organizations:   . Attends Archivist Meetings:   Marland Kitchen Marital Status:   Intimate Partner Violence:   . Fear of Current or Ex-Partner:   . Emotionally Abused:   Marland Kitchen Physically Abused:   . Sexually Abused:     Review of Systems: See HPI, otherwise negative ROS  Physical Exam: BP 119/77   Pulse 100   Temp 98.1 F (36.7 C) (Temporal)   Resp 16   Ht 5\' 2"  (1.575 m)   Wt 61.2 kg   LMP  11/17/2019   SpO2 97%   BMI 24.69 kg/m  General:   Alert,  pleasant and cooperative in NAD Head:  Normocephalic and atraumatic. Neck:  Supple; no masses or thyromegaly. Lungs:  Clear throughout to auscultation, normal respiratory effort.    Heart:  +S1, +S2, Regular rate and rhythm, No edema. Abdomen:  Soft, nontender and nondistended. Normal bowel sounds, without guarding, and without rebound.   Neurologic:  Alert and  oriented x4;  grossly normal neurologically.  Impression/Plan: Rachael Holloway is here for an colonoscopy to be performed for Screening colonoscopy average risk   Risks, benefits, limitations, and alternatives regarding  colonoscopy have been reviewed with the patient.  Questions have been answered.  All parties agreeable.   Jonathon Bellows, MD  11/27/2019, 7:46 AM

## 2019-11-27 NOTE — Op Note (Signed)
[facility] Gastroenterology Patient Name: Rachael Holloway Procedure Date: 11/27/2019 7:12 AM MRN: [id number] Account #: [id number] Date of Birth: 1975/08/02 Admit Type: Outpatient Age: 44 Room: [facility] ENDO ROOM 3 Gender: Female Note Status: Finalized Procedure:             Colonoscopy Indications:           Screening for colorectal malignant neoplasm Providers:             Jonathon Bellows MD, MD Referring MD:          Jearld Fenton (Referring MD) Medicines:             Monitored Anesthesia Care Complications:         No immediate complications. Procedure:             Pre-Anesthesia Assessment:                        - Prior to the procedure, a History and Physical was                         performed, and patient medications, allergies and                         sensitivities were reviewed. The patient's tolerance                         of previous anesthesia was reviewed.                        - ASA Grade Assessment: II - A patient with mild                         systemic disease.                        After obtaining informed consent, the colonoscope was                         passed under direct vision. Throughout the procedure,                         the patient's blood pressure, pulse, and oxygen                         saturations were monitored continuously. The                         Colonoscope was introduced through the anus and                         advanced to the the cecum, identified by the                         appendiceal orifice. The colonoscopy was performed                         with ease. The patient tolerated the procedure well.                         The quality of the bowel  preparation was excellent. Findings:      The perianal and digital rectal examinations were normal.      Multiple small-mouthed diverticula were found in the entire colon.      A 5 mm polyp was found in the ascending colon. The polyp was sessile.       The  polyp was removed with a cold snare. Resection and retrieval were       complete.      A 8 mm polyp was found in the sigmoid colon. The polyp was       semi-pedunculated. The polyp was removed with a hot snare. Resection and       retrieval were complete.      The exam was otherwise without abnormality on direct and retroflexion       views. Impression:            - Diverticulosis in the entire examined colon.                        - One 5 mm polyp in the ascending colon, removed with                         a cold snare. Resected and retrieved.                        - One 8 mm polyp in the sigmoid colon, removed with a                         hot snare. Resected and retrieved.                        - The examination was otherwise normal on direct and                         retroflexion views. Recommendation:        - Discharge patient to home (with escort).                        - Resume previous diet.                        - Continue present medications.                        - Await pathology results.                        - Repeat colonoscopy for surveillance based on                         pathology results. Procedure Code(s):     --- Professional ---                        [phone], Colonoscopy, flexible; with removal of                         tumor(s), polyp(s), or other lesion(s) by snare                         technique Diagnosis Code(s):     ---  Professional ---                        Z12.11, Encounter for screening for malignant neoplasm                         of colon                        K63.5, Polyp of colon                        K57.30, Diverticulosis of large intestine without                         perforation or abscess without bleeding CPT copyright 2019 American Medical Association. All rights reserved. The codes documented in this report are preliminary and upon coder review may  be revised to meet current compliance requirements. Jonathon Bellows,  MD Jonathon Bellows MD, MD 11/27/2019 8:07:29 AM This report has been signed electronically. Number of Addenda: 0 Note Initiated On: 11/27/2019 7:12 AM Scope Withdrawal Time: 0 hours 10 minutes 33 seconds  Total Procedure Duration: 0 hours 13 minutes 18 seconds  Estimated Blood Loss:  Estimated blood loss: none.      [facility]

## 2019-11-27 NOTE — Transfer of Care (Signed)
Immediate Anesthesia Transfer of Care Note  Patient: Rachael Holloway  Procedure(s) Performed: COLONOSCOPY WITH PROPOFOL (N/A )  Patient Location: PACU  Anesthesia Type:MAC and General  Level of Consciousness: drowsy  Airway & Oxygen Therapy: Patient Spontanous Breathing and Patient connected to nasal cannula oxygen  Post-op Assessment: Report given to RN and Post -op Vital signs reviewed and stable  Post vital signs: Reviewed and stable  Last Vitals:  Vitals Value Taken Time  BP 94/61 11/27/19 0809  Temp    Pulse 78 11/27/19 0810  Resp 17 11/27/19 0810  SpO2 100 % 11/27/19 0810  Vitals shown include unvalidated device data.  Last Pain:  Vitals:   11/27/19 0716  TempSrc: Temporal         Complications: No complications documented.

## 2019-11-30 LAB — SURGICAL PATHOLOGY

## 2019-12-03 ENCOUNTER — Encounter: Payer: Self-pay | Admitting: Gastroenterology

## 2019-12-29 ENCOUNTER — Ambulatory Visit (INDEPENDENT_AMBULATORY_CARE_PROVIDER_SITE_OTHER): Payer: BC Managed Care – PPO | Admitting: Psychology

## 2019-12-29 DIAGNOSIS — F3289 Other specified depressive episodes: Secondary | ICD-10-CM | POA: Diagnosis not present

## 2019-12-29 DIAGNOSIS — F418 Other specified anxiety disorders: Secondary | ICD-10-CM | POA: Diagnosis not present

## 2020-01-04 ENCOUNTER — Other Ambulatory Visit: Payer: Self-pay | Admitting: Internal Medicine

## 2020-01-04 DIAGNOSIS — E039 Hypothyroidism, unspecified: Secondary | ICD-10-CM

## 2020-01-19 ENCOUNTER — Other Ambulatory Visit: Payer: Self-pay | Admitting: Internal Medicine

## 2020-01-26 ENCOUNTER — Encounter: Payer: Self-pay | Admitting: Internal Medicine

## 2020-01-29 ENCOUNTER — Ambulatory Visit (INDEPENDENT_AMBULATORY_CARE_PROVIDER_SITE_OTHER): Payer: BC Managed Care – PPO | Admitting: Psychology

## 2020-01-29 DIAGNOSIS — F418 Other specified anxiety disorders: Secondary | ICD-10-CM

## 2020-01-29 DIAGNOSIS — F3289 Other specified depressive episodes: Secondary | ICD-10-CM | POA: Diagnosis not present

## 2020-03-18 ENCOUNTER — Ambulatory Visit (INDEPENDENT_AMBULATORY_CARE_PROVIDER_SITE_OTHER): Payer: BC Managed Care – PPO | Admitting: Psychology

## 2020-03-18 DIAGNOSIS — F411 Generalized anxiety disorder: Secondary | ICD-10-CM

## 2020-03-18 DIAGNOSIS — F3289 Other specified depressive episodes: Secondary | ICD-10-CM | POA: Diagnosis not present

## 2020-03-21 ENCOUNTER — Encounter: Payer: Self-pay | Admitting: Internal Medicine

## 2020-03-21 ENCOUNTER — Other Ambulatory Visit: Payer: Self-pay | Admitting: Internal Medicine

## 2020-03-21 DIAGNOSIS — Z1231 Encounter for screening mammogram for malignant neoplasm of breast: Secondary | ICD-10-CM

## 2020-03-27 ENCOUNTER — Other Ambulatory Visit: Payer: Self-pay | Admitting: Internal Medicine

## 2020-03-27 DIAGNOSIS — E039 Hypothyroidism, unspecified: Secondary | ICD-10-CM

## 2020-04-04 ENCOUNTER — Encounter: Payer: Self-pay | Admitting: Internal Medicine

## 2020-04-21 ENCOUNTER — Other Ambulatory Visit: Payer: Self-pay | Admitting: Internal Medicine

## 2020-04-25 ENCOUNTER — Ambulatory Visit (INDEPENDENT_AMBULATORY_CARE_PROVIDER_SITE_OTHER): Payer: BC Managed Care – PPO | Admitting: Psychology

## 2020-04-25 DIAGNOSIS — F3289 Other specified depressive episodes: Secondary | ICD-10-CM

## 2020-04-25 DIAGNOSIS — F418 Other specified anxiety disorders: Secondary | ICD-10-CM | POA: Diagnosis not present

## 2020-04-30 DIAGNOSIS — Z20822 Contact with and (suspected) exposure to covid-19: Secondary | ICD-10-CM | POA: Diagnosis not present

## 2020-06-08 ENCOUNTER — Ambulatory Visit (INDEPENDENT_AMBULATORY_CARE_PROVIDER_SITE_OTHER): Payer: BC Managed Care – PPO | Admitting: Psychology

## 2020-06-08 DIAGNOSIS — F3289 Other specified depressive episodes: Secondary | ICD-10-CM

## 2020-06-08 DIAGNOSIS — F418 Other specified anxiety disorders: Secondary | ICD-10-CM

## 2020-06-21 ENCOUNTER — Ambulatory Visit
Admission: RE | Admit: 2020-06-21 | Discharge: 2020-06-21 | Disposition: A | Discharge status: Home / Self Care | Payer: BC Managed Care – PPO | Source: Ambulatory Visit | Attending: Internal Medicine | Admitting: Internal Medicine

## 2020-06-21 ENCOUNTER — Other Ambulatory Visit: Payer: Self-pay

## 2020-06-21 DIAGNOSIS — Z1231 Encounter for screening mammogram for malignant neoplasm of breast: Secondary | ICD-10-CM | POA: Diagnosis not present

## 2020-07-01 ENCOUNTER — Other Ambulatory Visit: Payer: Self-pay | Admitting: Internal Medicine

## 2020-07-01 DIAGNOSIS — E039 Hypothyroidism, unspecified: Secondary | ICD-10-CM

## 2020-07-20 ENCOUNTER — Other Ambulatory Visit: Payer: Self-pay | Admitting: Internal Medicine

## 2020-07-26 ENCOUNTER — Ambulatory Visit (INDEPENDENT_AMBULATORY_CARE_PROVIDER_SITE_OTHER): Payer: BC Managed Care – PPO | Admitting: Psychology

## 2020-07-26 DIAGNOSIS — F3189 Other bipolar disorder: Secondary | ICD-10-CM | POA: Diagnosis not present

## 2020-09-30 ENCOUNTER — Other Ambulatory Visit: Payer: Self-pay | Admitting: Internal Medicine

## 2020-09-30 DIAGNOSIS — E039 Hypothyroidism, unspecified: Secondary | ICD-10-CM

## 2020-10-03 ENCOUNTER — Other Ambulatory Visit: Payer: Self-pay | Admitting: Internal Medicine

## 2020-10-06 ENCOUNTER — Telehealth: Payer: Self-pay

## 2020-10-06 NOTE — Telephone Encounter (Signed)
Moving to [employer]

## 2020-10-07 ENCOUNTER — Encounter: Payer: Self-pay | Admitting: Internal Medicine

## 2020-10-07 ENCOUNTER — Ambulatory Visit (INDEPENDENT_AMBULATORY_CARE_PROVIDER_SITE_OTHER): Payer: BC Managed Care – PPO | Admitting: Internal Medicine

## 2020-10-07 ENCOUNTER — Other Ambulatory Visit: Payer: Self-pay

## 2020-10-07 VITALS — BP 103/76 | HR 89 | Temp 98.4°F | Resp 18 | Ht 62.0 in | Wt 140.4 lb

## 2020-10-07 DIAGNOSIS — F419 Anxiety disorder, unspecified: Secondary | ICD-10-CM | POA: Diagnosis not present

## 2020-10-07 DIAGNOSIS — F32A Depression, unspecified: Secondary | ICD-10-CM | POA: Diagnosis not present

## 2020-10-07 DIAGNOSIS — E039 Hypothyroidism, unspecified: Secondary | ICD-10-CM | POA: Diagnosis not present

## 2020-10-07 DIAGNOSIS — Z23 Encounter for immunization: Secondary | ICD-10-CM | POA: Diagnosis not present

## 2020-10-07 DIAGNOSIS — Z0001 Encounter for general adult medical examination with abnormal findings: Secondary | ICD-10-CM

## 2020-10-07 DIAGNOSIS — E663 Overweight: Secondary | ICD-10-CM

## 2020-10-07 DIAGNOSIS — G43019 Migraine without aura, intractable, without status migrainosus: Secondary | ICD-10-CM | POA: Diagnosis not present

## 2020-10-07 DIAGNOSIS — R6882 Decreased libido: Secondary | ICD-10-CM

## 2020-10-07 NOTE — Assessment & Plan Note (Signed)
She will schedule lab visit for TSH, free T4 and T3 We will adjust Levothyroxine if needed based on labs

## 2020-10-07 NOTE — Patient Instructions (Signed)
Health Maintenance, Female Adopting a healthy lifestyle and getting preventive care are important in promoting health and wellness. Ask your health care provider about:  The right schedule for you to have regular tests and exams.  Things you can do on your own to prevent diseases and keep yourself healthy. What should I know about diet, weight, and exercise? Eat a healthy diet  Eat a diet that includes plenty of vegetables, fruits, low-fat dairy products, and lean protein.  Do not eat a lot of foods that are high in solid fats, added sugars, or sodium.   Maintain a healthy weight Body mass index (BMI) is used to identify weight problems. It estimates body fat based on height and weight. Your health care provider can help determine your BMI and help you achieve or maintain a healthy weight. Get regular exercise Get regular exercise. This is one of the most important things you can do for your health. Most adults should:  Exercise for at least 150 minutes each week. The exercise should increase your heart rate and make you sweat (moderate-intensity exercise).  Do strengthening exercises at least twice a week. This is in addition to the moderate-intensity exercise.  Spend less time sitting. Even light physical activity can be beneficial. Watch cholesterol and blood lipids Have your blood tested for lipids and cholesterol at 47 years of age, then have this test every 5 years. Have your cholesterol levels checked more often if:  Your lipid or cholesterol levels are high.  You are older than 47 years of age.  You are at high risk for heart disease. What should I know about cancer screening? Depending on your health history and family history, you may need to have cancer screening at various ages. This may include screening for:  Breast cancer.  Cervical cancer.  Colorectal cancer.  Skin cancer.  Lung cancer. What should I know about heart disease, diabetes, and high blood  pressure? Blood pressure and heart disease  High blood pressure causes heart disease and increases the risk of stroke. This is more likely to develop in people who have high blood pressure readings, are of African descent, or are overweight.  Have your blood pressure checked: ? Every 3-5 years if you are 18-39 years of age. ? Every year if you are 40 years old or older. Diabetes Have regular diabetes screenings. This checks your fasting blood sugar level. Have the screening done:  Once every three years after age 40 if you are at a normal weight and have a low risk for diabetes.  More often and at a younger age if you are overweight or have a high risk for diabetes. What should I know about preventing infection? Hepatitis B If you have a higher risk for hepatitis B, you should be screened for this virus. Talk with your health care provider to find out if you are at risk for hepatitis B infection. Hepatitis C Testing is recommended for:  Everyone born from 1945 through 1965.  Anyone with known risk factors for hepatitis C. Sexually transmitted infections (STIs)  Get screened for STIs, including gonorrhea and chlamydia, if: ? You are sexually active and are younger than 47 years of age. ? You are older than 47 years of age and your health care provider tells you that you are at risk for this type of infection. ? Your sexual activity has changed since you were last screened, and you are at increased risk for chlamydia or gonorrhea. Ask your health care provider   if you are at risk.  Ask your health care provider about whether you are at high risk for HIV. Your health care provider may recommend a prescription medicine to help prevent HIV infection. If you choose to take medicine to prevent HIV, you should first get tested for HIV. You should then be tested every 3 months for as long as you are taking the medicine. Pregnancy  If you are about to stop having your period (premenopausal) and  you may become pregnant, seek counseling before you get pregnant.  Take 400 to 800 micrograms (mcg) of folic acid every day if you become pregnant.  Ask for birth control (contraception) if you want to prevent pregnancy. Osteoporosis and menopause Osteoporosis is a disease in which the bones lose minerals and strength with aging. This can result in bone fractures. If you are 65 years old or older, or if you are at risk for osteoporosis and fractures, ask your health care provider if you should:  Be screened for bone loss.  Take a calcium or vitamin D supplement to lower your risk of fractures.  Be given hormone replacement therapy (HRT) to treat symptoms of menopause. Follow these instructions at home: Lifestyle  Do not use any products that contain nicotine or tobacco, such as cigarettes, e-cigarettes, and chewing tobacco. If you need help quitting, ask your health care provider.  Do not use street drugs.  Do not share needles.  Ask your health care provider for help if you need support or information about quitting drugs. Alcohol use  Do not drink alcohol if: ? Your health care provider tells you not to drink. ? You are pregnant, may be pregnant, or are planning to become pregnant.  If you drink alcohol: ? Limit how much you use to 0-1 drink a day. ? Limit intake if you are breastfeeding.  Be aware of how much alcohol is in your drink. In the [place] one drink equals one 12 oz bottle of beer (355 mL), one 5 oz glass of wine (148 mL), or one 1 oz glass of hard liquor (44 mL). General instructions  Schedule regular health, dental, and eye exams.  Stay current with your vaccines.  Tell your health care provider if: ? You often feel depressed. ? You have ever been abused or do not feel safe at home. Summary  Adopting a healthy lifestyle and getting preventive care are important in promoting health and wellness.  Follow your health care provider's instructions about healthy  diet, exercising, and getting tested or screened for diseases.  Follow your health care provider's instructions on monitoring your cholesterol and blood pressure. This information is not intended to replace advice given to you by your health care provider. Make sure you discuss any questions you have with your health care provider. Document Revised: 04/30/2018 Document Reviewed: 04/30/2018 Elsevier Patient Education  2021 Elsevier Inc.  

## 2020-10-07 NOTE — Assessment & Plan Note (Signed)
Rare Continue ibuprofen OTC as needed

## 2020-10-07 NOTE — Assessment & Plan Note (Signed)
Persistent Advised her she could go up to 15 to 20 mg daily on her Escitalopram if she wanted to She will consider this and let me know if she decides to do this Support offered

## 2020-10-07 NOTE — Assessment & Plan Note (Signed)
Chronic, likely secondary to medication Discussed use of Addyi, this is something she would need to see GYN for

## 2020-10-07 NOTE — Progress Notes (Signed)
Subjective:    Patient ID: Rachael Holloway, female    DOB: December 06, 1975, 45 y.o.   MRN: [id number]  HPI  Pt presents to the clinic today for her annual exam. She is also due to follow up chronic conditions.  Anxiety and Depression: Chronic dysthymia with intermittent anxiety.  She does feel like her anxiety is well controlled but her depression is slightly worse.  She has noticed that she is sleeping more and gained a little weight.  She is taking Escitalopram as prescribed. She is seeing a therapist. She denies SI/HI.  Migraines: These occur every few months. She takes Ibuprofen and caffeine with good relief of symptoms. She also gets relief with cool compresses and laying down.  Hypothyroidism: She denies any issues on her current dose of Levothyroxine. She does not follow with endocrinology.  Flu: 01/2020 Tetanus: 04/2010 Covid: Pfizer x 3 Pap smear: 08/2019 Mammogram: 06/2020 Colon Screening: 11/2019 Vision Screening: annually Dentist: biannually  Diet: She does eat some meat. She consumes fruits and veggies. She does not eat a lot of fried foods. She drinks mostly hot tea and water. Exercise: Walking  Review of Systems      Past Medical History:  Diagnosis Date  . Allergy   . Chicken pox   . Depression   . Migraines   . Thyroid disease   . White coat hypertension     Current Outpatient Medications  Medication Sig Dispense Refill  . Cholecalciferol (VITAMIN D3) 2000 UNITS TABS Take 2 capsules by mouth. 2 times a week    . DiphenhydrAMINE HCl (BENADRYL ALLERGY PO) Take 1 capsule by mouth as needed.    Marland Kitchen escitalopram (LEXAPRO) 10 MG tablet TAKE 1 TABLET BY MOUTH EVERY DAY 90 tablet 0  . Flaxseed, Linseed, (FLAXSEED OIL PO) Take 1 capsule by mouth 2 (two) times daily.    Derald Macleod Factor (INTRINSI B12-FOLATE) [phone] MCG-MCG-MG TABS Take 1 capsule by mouth daily.    Marland Kitchen ibuprofen (ADVIL,MOTRIN) 400 MG tablet Take 400 mg by mouth as needed.    Marland Kitchen  levothyroxine (SYNTHROID) 112 MCG tablet TAKE 1 TABLET BY MOUTH EVERY DAY 90 tablet 0  . loratadine (CLARITIN) 10 MG tablet Take 10 mg by mouth daily.    Marland Kitchen NIACINAMIDE-ZINC-COPPER-FA PO Take 1 capsule by mouth daily.     No current facility-administered medications for this visit.    Allergies  Allergen Reactions  . Dust Mite Mixed Allergen Ext [Mite (D. Farinae)]   . Mold Extract [Trichophyton]   . Pollen Extract     Family History  Problem Relation Age of Onset  . Heart disease Father   . Hypertension Father   . Stroke Maternal Grandmother   . Stroke Paternal Grandfather   . Breast cancer Cousin   . Cancer Neg Hx   . Diabetes Neg Hx     Social History   Socioeconomic History  . Marital status: Married    Spouse name: Not on file  . Number of children: Not on file  . Years of education: Not on file  . Highest education level: Not on file  Occupational History  . Not on file  Tobacco Use  . Smoking status: Never Smoker  . Smokeless tobacco: Never Used  Vaping Use  . Vaping Use: Never used  Substance and Sexual Activity  . Alcohol use: Yes    Alcohol/week: 5.0 standard drinks    Types: 5 Glasses of wine per week    Comment: moderate  . Drug  use: No  . Sexual activity: Yes  Other Topics Concern  . Not on file  Social History Narrative  . Not on file   Social Determinants of Health   Financial Resource Strain: Not on file  Food Insecurity: Not on file  Transportation Needs: Not on file  Physical Activity: Not on file  Stress: Not on file  Social Connections: Not on file  Intimate Partner Violence: Not on file     Constitutional: Pt reports intermittent migraines. Denies fever, malaise, fatigue, or abrupt weight changes.  HEENT: Denies eye pain, eye redness, ear pain, ringing in the ears, wax buildup, runny nose, nasal congestion, bloody nose, or sore throat. Respiratory: Denies difficulty breathing, shortness of breath, cough or sputum production.    Cardiovascular: Denies chest pain, chest tightness, palpitations or swelling in the hands or feet.  Gastrointestinal: Denies abdominal pain, bloating, constipation, diarrhea or blood in the stool.  GU: Patient reports decreased libido.  Denies urgency, frequency, pain with urination, burning sensation, blood in urine, odor or discharge. Musculoskeletal: Denies decrease in range of motion, difficulty with gait, muscle pain or joint pain and swelling.  Skin: Denies redness, rashes, lesions or ulcercations.  Neurological: Denies dizziness, difficulty with memory, difficulty with speech or problems with balance and coordination.  Psych: Pt has a history of anxiety and depression. Denies SI/HI.  No other specific complaints in a complete review of systems (except as listed in HPI above).  Objective:   Physical Exam BP 103/76 (BP Location: Right Arm, Patient Position: Sitting, Cuff Size: Normal)   Pulse 89   Temp 98.4 F (36.9 C) (Temporal)   Resp 18   Ht 5\' 2"  (1.575 m)   Wt 140 lb 6.4 oz (63.7 kg)   LMP 10/01/2020   SpO2 99%   BMI 25.68 kg/m   Wt Readings from Last 3 Encounters:  11/27/19 135 lb (61.2 kg)  09/09/19 135 lb (61.2 kg)  11/06/18 132 lb (59.9 kg)    General: Appears her stated age, well developed, well nourished in NAD. Skin: Warm, dry and intact. No rashesnoted. HEENT: Head: normal shape and size; Eyes: sclera white and EOMs intact; Neck:  Neck supple, trachea midline. No masses, lumps or thyromegaly present.  Cardiovascular: Normal rate and rhythm. S1,S2 noted.  No murmur, rubs or gallops noted. No JVD or BLE edema.  Pulmonary/Chest: Normal effort and positive vesicular breath sounds. No respiratory distress. No wheezes, rales or ronchi noted.  Abdomen: Soft and nontender. Normal bowel sounds. No distention or masses noted. Liver, spleen and kidneys non palpable. Musculoskeletal: Strength 5/5 BUE/BLE no difficulty with gait.  Neurological: Alert and oriented. Cranial  nerves II-XII grossly intact. Coordination normal.  Psychiatric: Mood and affect normal. Behavior is normal. Judgment and thought content normal.     BMET    Component Value Date/Time   NA 137 09/09/2019 1541   NA 142 11/06/2018 1433   K 4.5 09/09/2019 1541   CL 100 09/09/2019 1541   CO2 30 09/09/2019 1541   GLUCOSE 86 09/09/2019 1541   BUN 15 09/09/2019 1541   BUN 10 11/06/2018 1433   CREATININE 0.76 09/09/2019 1541   CREATININE 0.86 04/10/2017 1547   CALCIUM 9.6 09/09/2019 1541   GFRNONAA 86 11/06/2018 1433   GFRAA 99 11/06/2018 1433    Lipid Panel     Component Value Date/Time   CHOL 204 (H) 10/08/2019 1023   CHOL 219 (H) 06/04/2018 1449   TRIG 173.0 (H) 10/08/2019 1023   HDL 66.10  10/08/2019 1023   HDL 74 06/04/2018 1449   CHOLHDL 3 10/08/2019 1023   VLDL 34.6 10/08/2019 1023   LDLCALC 104 (H) 10/08/2019 1023   LDLCALC 110 (H) 06/04/2018 1449   LDLCALC 97 04/10/2017 1547    CBC    Component Value Date/Time   WBC 7.6 09/09/2019 1541   RBC 4.56 09/09/2019 1541   HGB 14.7 09/09/2019 1541   HGB 14.3 06/04/2018 1449   HCT 43.2 09/09/2019 1541   HCT 42.4 06/04/2018 1449   PLT 283.0 09/09/2019 1541   PLT 298 06/04/2018 1449   MCV 94.7 09/09/2019 1541   MCV 92 06/04/2018 1449   MCH 31.0 06/04/2018 1449   MCH 31.1 04/10/2017 1547   MCHC 34.0 09/09/2019 1541   RDW 12.7 09/09/2019 1541   RDW 11.8 06/04/2018 1449    Hgb A1C No results found for: HGBA1C          Assessment & Plan:   Preventative Health Maintenance:  Encouraged her to get a flu shot in the fall Tetanus today Covid UTD Pap smear UTD Mammogram UTD Colon screening UTD Encouraged her to consume a balanced diet and exercise regimen Advised her to see an eye doctor and dentist annually Will check CBC, CMET, TSH, free T4, T3, lipid and Hep C today  RTC in 1 year, sooner if needed Webb Silversmith, NP This visit occurred during the SARS-CoV-2 public health emergency.  Safety protocols were  in place, including screening questions prior to the visit, additional usage of staff PPE, and extensive cleaning of exam room while observing appropriate contact time as indicated for disinfecting solutions.

## 2020-10-10 ENCOUNTER — Ambulatory Visit (INDEPENDENT_AMBULATORY_CARE_PROVIDER_SITE_OTHER): Payer: BC Managed Care – PPO | Admitting: Psychology

## 2020-10-10 DIAGNOSIS — F3289 Other specified depressive episodes: Secondary | ICD-10-CM

## 2020-10-10 DIAGNOSIS — F418 Other specified anxiety disorders: Secondary | ICD-10-CM | POA: Diagnosis not present

## 2020-10-13 ENCOUNTER — Other Ambulatory Visit: Payer: Self-pay

## 2020-10-13 DIAGNOSIS — Z0001 Encounter for general adult medical examination with abnormal findings: Secondary | ICD-10-CM

## 2020-10-13 DIAGNOSIS — L858 Other specified epidermal thickening: Secondary | ICD-10-CM | POA: Diagnosis not present

## 2020-10-13 DIAGNOSIS — L718 Other rosacea: Secondary | ICD-10-CM | POA: Diagnosis not present

## 2020-10-13 DIAGNOSIS — D2261 Melanocytic nevi of right upper limb, including shoulder: Secondary | ICD-10-CM | POA: Diagnosis not present

## 2020-10-13 DIAGNOSIS — F1910 Other psychoactive substance abuse, uncomplicated: Secondary | ICD-10-CM

## 2020-10-13 DIAGNOSIS — E039 Hypothyroidism, unspecified: Secondary | ICD-10-CM

## 2020-10-14 LAB — LIPID PANEL
Chol/HDL Ratio: 3.9 ratio (ref 0.0–4.4)
Cholesterol, Total: 216 mg/dL — ABNORMAL HIGH (ref 100–199)
HDL: 56 mg/dL (ref >39)
LDL Chol Calc (NIH): 130 mg/dL — ABNORMAL HIGH (ref 0–99)
Triglycerides: 172 mg/dL — ABNORMAL HIGH (ref 0–149)
VLDL Cholesterol Cal: 30 mg/dL (ref 5–40)

## 2020-10-14 LAB — CBC WITH DIFFERENTIAL/PLATELET
Basophils Absolute: 0.1 10*3/uL (ref 0.0–0.2)
Basos: 1 %
EOS (ABSOLUTE): 0.2 10*3/uL (ref 0.0–0.4)
Eos: 3 %
Hematocrit: 42.6 % (ref 34.0–46.6)
Hemoglobin: 14.1 g/dL (ref 11.1–15.9)
Immature Grans (Abs): 0 10*3/uL (ref 0.0–0.1)
Immature Granulocytes: 0 %
Lymphocytes Absolute: 2.2 10*3/uL (ref 0.7–3.1)
Lymphs: 32 %
MCH: 31.4 pg (ref 26.6–33.0)
MCHC: 33.1 g/dL (ref 31.5–35.7)
MCV: 95 fL (ref 79–97)
Monocytes Absolute: 0.5 10*3/uL (ref 0.1–0.9)
Monocytes: 6 %
Neutrophils Absolute: 4 10*3/uL (ref 1.4–7.0)
Neutrophils: 58 %
Platelets: 280 10*3/uL (ref 150–450)
RBC: 4.49 x10E6/uL (ref 3.77–5.28)
RDW: 11.8 % (ref 11.7–15.4)
WBC: 7 10*3/uL (ref 3.4–10.8)

## 2020-10-14 LAB — COMPREHENSIVE METABOLIC PANEL
ALT: 11 IU/L (ref 0–32)
AST: 12 IU/L (ref 0–40)
Albumin/Globulin Ratio: 1.4 (ref 1.2–2.2)
Albumin: 3.6 g/dL — ABNORMAL LOW (ref 3.8–4.8)
Alkaline Phosphatase: 80 IU/L (ref 44–121)
BUN/Creatinine Ratio: 11 (ref 9–23)
BUN: 8 mg/dL (ref 6–24)
Bilirubin Total: 0.4 mg/dL (ref 0.0–1.2)
CO2: 21 mmol/L (ref 20–29)
Calcium: 9 mg/dL (ref 8.7–10.2)
Chloride: 103 mmol/L (ref 96–106)
Creatinine, Ser: 0.7 mg/dL (ref 0.57–1.00)
Globulin, Total: 2.5 g/dL (ref 1.5–4.5)
Glucose: 98 mg/dL (ref 65–99)
Potassium: 4.2 mmol/L (ref 3.5–5.2)
Sodium: 139 mmol/L (ref 134–144)
Total Protein: 6.1 g/dL (ref 6.0–8.5)
eGFR: 108 mL/min/{1.73_m2} (ref >59)

## 2020-10-14 LAB — T4, FREE: Free T4: 1.34 ng/dL (ref 0.82–1.77)

## 2020-10-14 LAB — TSH: TSH: 1.96 u[IU]/mL (ref 0.450–4.500)

## 2020-10-14 LAB — HEPATITIS C ANTIBODY: Hep C Virus Ab: <0.1 s/co ratio (ref 0.0–0.9)

## 2020-10-14 LAB — T3, FREE: T3, Free: 2.5 pg/mL (ref 2.0–4.4)

## 2020-12-05 ENCOUNTER — Ambulatory Visit (INDEPENDENT_AMBULATORY_CARE_PROVIDER_SITE_OTHER): Payer: BC Managed Care – PPO | Admitting: Psychology

## 2020-12-05 DIAGNOSIS — F3289 Other specified depressive episodes: Secondary | ICD-10-CM

## 2020-12-05 DIAGNOSIS — F418 Other specified anxiety disorders: Secondary | ICD-10-CM

## 2021-01-02 ENCOUNTER — Other Ambulatory Visit: Payer: Self-pay | Admitting: Internal Medicine

## 2021-01-02 DIAGNOSIS — E039 Hypothyroidism, unspecified: Secondary | ICD-10-CM

## 2021-02-17 ENCOUNTER — Ambulatory Visit: Payer: BC Managed Care – PPO | Admitting: Psychology

## 2021-02-17 DIAGNOSIS — F3289 Other specified depressive episodes: Secondary | ICD-10-CM | POA: Diagnosis not present

## 2021-02-17 DIAGNOSIS — F418 Other specified anxiety disorders: Secondary | ICD-10-CM | POA: Diagnosis not present

## 2021-03-15 ENCOUNTER — Ambulatory Visit: Payer: Self-pay

## 2021-03-15 ENCOUNTER — Other Ambulatory Visit: Payer: Self-pay

## 2021-03-15 DIAGNOSIS — Z23 Encounter for immunization: Secondary | ICD-10-CM

## 2021-03-30 ENCOUNTER — Other Ambulatory Visit: Payer: Self-pay | Admitting: Internal Medicine

## 2021-03-30 DIAGNOSIS — E039 Hypothyroidism, unspecified: Secondary | ICD-10-CM

## 2021-03-30 NOTE — Telephone Encounter (Signed)
Patient will need an office visit for further refills. Requested Prescriptions  Pending Prescriptions Disp Refills  . escitalopram (LEXAPRO) 10 MG tablet [Pharmacy Med Name: ESCITALOPRAM 10 MG TABLET] 90 tablet 0    Sig: TAKE 1 TABLET BY MOUTH EVERY DAY     Psychiatry:  Antidepressants - SSRI Passed - 03/30/2021  4:20 AM      Passed - Completed PHQ-2 or PHQ-9 in the last 360 days      Passed - Valid encounter within last 6 months    Recent Outpatient Visits          5 months ago Encounter for general adult medical examination with abnormal findings   [facility] [place], Coralie Keens, NP             . levothyroxine (SYNTHROID) 112 MCG tablet [Pharmacy Med Name: LEVOTHYROXINE 112 MCG TABLET] 90 tablet 1    Sig: TAKE 1 TABLET BY MOUTH EVERY DAY     Endocrinology:  Hypothyroid Agents Failed - 03/30/2021  4:20 AM      Failed - TSH needs to be rechecked within 3 months after an abnormal result. Refill until TSH is due.      Passed - TSH in normal range and within 360 days    TSH  Date Value Ref Range Status  10/13/2020 1.960 0.450 - 4.500 uIU/mL Final         Passed - Valid encounter within last 12 months    Recent Outpatient Visits          5 months ago Encounter for general adult medical examination with abnormal findings   [facility], Coralie Keens, NP

## 2021-05-03 ENCOUNTER — Ambulatory Visit: Payer: BC Managed Care – PPO | Admitting: Psychology

## 2021-05-03 DIAGNOSIS — F3289 Other specified depressive episodes: Secondary | ICD-10-CM

## 2021-05-03 DIAGNOSIS — F418 Other specified anxiety disorders: Secondary | ICD-10-CM

## 2021-05-03 NOTE — Progress Notes (Signed)
[facility] Counselor/Therapist Progress Note  Patient ID: GRACLYNN VANANTWERP, MRN: [id number]    Date: 05/03/21  Time Spent: 1:34  pm - 2:28 pm : 54 Minutes  Treatment Type: Individual Therapy.  Reported Symptoms: increased depressive symptoms, intrusive thoughts (negative self-talk), low motivation, grief.    Mental Status Exam: Appearance:  Fairly Groomed     Behavior: Appropriate  Motor: Normal  Speech/Language:  Clear and Coherent  Affect: Tearful  Mood: depressed and sad  Thought process: normal  Thought content:   WNL  Sensory/Perceptual disturbances:   WNL  Orientation: oriented to person, place, time/date, and situation  Attention: Good  Concentration: Good  Memory: WNL  Fund of knowledge:  Good  Insight:   Good  Judgment:  Good  Impulse Control: Good   Risk Assessment: Danger to Self:  No Self-injurious Behavior: No Danger to Others: No Duty to Warn:no Physical Aggression / Violence:No  Access to Firearms a concern: No  Gang Involvement:No   Subjective:   Berline Lopes participated from home, via video, and consented to treatment. Therapist participated from home office. We met online due to [place] pandemic. Shital reviewed the events of the past week.  Kyra discussed an increase in severity of her depressive symptoms including negative self talk, automatic negative thoughts, lethargy, low motivation, and sadness.  She discussed the onset of this occurring around November around the time that her maternal uncle passed.  This is 1 year after her father's passing.  She then discussed difficulty getting motivated to complete self-care tasks including exercise and often noted gravitating towards staying in bed more often and eating more carbs.  Mckayla noted that this typically happens around wintertime with the colder weather and shorter days.  Some of her negative automatic self talk includes "everything is my fault" and "I am no good".  We  explored her negative self talk and discussed her attempts to manage her overall mood.  Therapist provided psychoeducation regarding depressive symptoms and encouraged Tijana to contact her provider to discuss possible changes to medication along with vitamin D deficiency testing as she has a history of being vitamin D deficient.  Therapist validated and acknowledged Vinnie Level is experiencing feelings.  Large portion of the session was dedicated to identify ways to increase engagement in self-care employing the behavioral activation principle including setting reasonable goals, scheduling activity, identifying barriers, and being prepared ahead of time.  We discussed the use of reinforcers as a possible tool to increase engagement.  Therapist employed CBT principles to work on identifying self talk and challenging that those via positive self talk, boundaries, identifying positive behaviors feelings and activities throughout the day. Rubee was engaged and motivated during the session.  We reflected on her past progress in therapy.  We scheduled a follow-up appointment at the beginning of next year to get an update regarding her symptoms and general progress.  Therapist validated and acknowledged her feelings of sadness and grief and provided supportive therapy. Petrina expressed verbal commitment towards her goals and employing interventions and tools we discussed during this session and previous to address her overall mood.   Interventions: Cognitive Behavioral Therapy, Mindfulness Meditation, and Behavioral Activation  Diagnosis:  Other depression  Other specified anxiety disorders  Treatment Plan:  Client Abilities/Strengths Rosia is intelligent, forthcoming, and motivated for change.   Client Treatment Preferences Outpatient Therapy.   Client Statement of Needs Kalen discussed her goals for treatment including continuing journaling, continued consistent physical exercises, boundaries with  self for  work, actively managing stressors, and managing overall mood.  Treatment Level Weekly  Symptoms Depression: Loss of interest, feeling down, lot motivation, lethargy, mild hypersomnia, feeling bad about self.  (Status: declined)  Anxiety: Difficulty concentrating, somatic complaints (nausea, loose bowels), anticipatory anxiety, muscle tension, consistent worry ([name]'s health, green-card, bureaucracy), anticipatory anxiety, (Status: improved)   Goals:  Enrica experiences symptoms of of depression and anxiety.   Target Date: 02/17/22 Frequency: Weekly  Progress: 0 Modality: individual    Therapist will provide referrals for additional resources as appropriate.  Therapist will provide psycho-education regarding Shaelee's diagnosis and corresponding treatment approaches and interventions. Amadi will employ CBT, BA, Problem-solving, Solution Focused, Mindfulness, and coping skills to help manage decrease symptoms associated with her diagnosis.   Reduce overall level, frequency, and intensity of the feelings of depression and anxiety evidenced by decreased symptomology from 6 to 7 days/week to 0 to 1 days/week per client report for at least 3 consecutive months. Verbally express understanding of the relationship between feelings of depression, anxiety and their impact on thinking patterns and behaviors. Verbalize an understanding of the role that distorted thinking plays in creating fears, excessive worry, and ruminations.  [name] participated in the creation of the treatment plan)   [name], LCSW

## 2021-05-23 ENCOUNTER — Ambulatory Visit: Payer: BC Managed Care – PPO | Admitting: Psychology

## 2021-05-23 DIAGNOSIS — F418 Other specified anxiety disorders: Secondary | ICD-10-CM

## 2021-05-23 DIAGNOSIS — F3289 Other specified depressive episodes: Secondary | ICD-10-CM

## 2021-05-23 NOTE — Progress Notes (Signed)
[place] Counselor/Therapist Progress Note  Patient ID: Rachael Holloway, MRN: [id number]    Date: 05/23/21  Time Spent: 5:03 pm - 5: 46pm : 50 Minutes  Treatment Type: Individual Therapy.  Reported Symptoms: increased depressive symptoms, intrusive thoughts (negative self-talk), low motivation, grief.    Mental Status Exam: Appearance:  Fairly Groomed     Behavior: Appropriate  Motor: Normal  Speech/Language:  Clear and Coherent  Affect: Tearful  Mood: depressed and sad  Thought process: normal  Thought content:   WNL  Sensory/Perceptual disturbances:   WNL  Orientation: oriented to person, place, time/date, and situation  Attention: Good  Concentration: Good  Memory: WNL  Fund of knowledge:  Good  Insight:   Good  Judgment:  Good  Impulse Control: Good   Risk Assessment: Danger to Self:  No Self-injurious Behavior: No Danger to Others: No Duty to Warn:no Physical Aggression / Violence:No  Access to Firearms a concern: No  Gang Involvement:No   Subjective:   Rachael Holloway participated from home, via video, and consented to treatment. Therapist participated from home office. We met online due to [place] pandemic. Rachael Holloway reviewed the events of the past week.  Rachael Holloway note some improvement in her overall mood and noted feeling "less waves of despair" and noted increased exercise, check-ins with friends, and using a therapy light.  She noted barriers to a positive mood which include the end of a task or experience, weather changes, shorter days, and the mood of the people around her.  We worked on being mindful, day-to-day, which Rachael Holloway noted having difficulty with. She noted often expending significant effort during the semester and the cost of this ,after the fact.  We explored the cost of this during the session.  We worked on identifying ways to become more mindful, via daily check-ins, to assess physiological and psychological symptoms.  Therapist  working worked on Higher education careers adviser progress in regards to symptom management of anxiety.  Therapist encouraged Rachael Holloway to identify ways to consider getting perspectives, challenging negative thoughts and feelings which therapist modeled during the session.  She seems engaged and motivated during the session and expressed commitment towards her goals.  We scheduled a follow-up appointment for continued treatment.  She is and discussed her plans to contact therapist, prior to appointment, should the need arise.  Therapist provided supportive therapy and praised Rachael Holloway for her effort.  Interventions: Cognitive Behavioral Therapy, Mindfulness Meditation, and Behavioral Activation  Diagnosis:  Other depression  Other specified anxiety disorders  Treatment Plan:  Client Abilities/Strengths Rachael Holloway is intelligent, forthcoming, and motivated for change.   Client Treatment Preferences Outpatient Therapy.   Client Statement of Needs Rachael Holloway discussed her goals for treatment including continuing journaling, continued consistent physical exercises, boundaries with self for work, actively managing stressors, and managing overall mood.  Treatment Level Weekly  Symptoms Depression: Loss of interest, feeling down, lot motivation, lethargy, mild hypersomnia, feeling bad about self.  (Status: declined)  Anxiety: Difficulty concentrating, somatic complaints (nausea, loose bowels), anticipatory anxiety, muscle tension, consistent worry (Rachael Holloway's health, green-card, bureaucracy), anticipatory anxiety, (Status: improved)   Goals:  Rachael Holloway experiences symptoms of of depression and anxiety.   Target Date: 02/17/22 Frequency: Weekly  Progress: 0 Modality: individual    Therapist will provide referrals for additional resources as appropriate.  Therapist will provide psycho-education regarding Rachael Holloway's diagnosis and corresponding treatment approaches and interventions. Rachael Holloway will employ CBT, BA,  Problem-solving, Solution Focused, Mindfulness, and coping skills to help manage decrease symptoms associated with her  diagnosis.   Reduce overall level, frequency, and intensity of the feelings of depression and anxiety evidenced by decreased symptomology from 6 to 7 days/week to 0 to 1 days/week per client report for at least 3 consecutive months. Verbally express understanding of the relationship between feelings of depression, anxiety and their impact on thinking patterns and behaviors. Verbalize an understanding of the role that distorted thinking plays in creating fears, excessive worry, and ruminations.  Rachael Holloway participated in the creation of the treatment plan)   [name], LCSW

## 2021-05-30 ENCOUNTER — Other Ambulatory Visit: Payer: Self-pay | Admitting: Internal Medicine

## 2021-05-30 DIAGNOSIS — Z1231 Encounter for screening mammogram for malignant neoplasm of breast: Secondary | ICD-10-CM

## 2021-06-25 ENCOUNTER — Other Ambulatory Visit: Payer: Self-pay | Admitting: Internal Medicine

## 2021-06-26 NOTE — Telephone Encounter (Signed)
Requested medication (s) are due for refill today - yes  Requested medication (s) are on the active medication list -yes  Future visit scheduled - no  Last refill: 03/30/21 #90  Notes to clinic: Attempted to call patient to schedule appointment- left message to call office. Patient last RF has notes- sent for PCP review.   Requested Prescriptions  Pending Prescriptions Disp Refills   escitalopram (LEXAPRO) 10 MG tablet [Pharmacy Med Name: ESCITALOPRAM 10 MG TABLET] 90 tablet 0    Sig: TAKE 1 TABLET BY MOUTH EVERY DAY     Psychiatry:  Antidepressants - SSRI Failed - 06/25/2021  9:19 AM      Failed - Valid encounter within last 6 months    Recent Outpatient Visits           8 months ago Encounter for general adult medical examination with abnormal findings   [facility] [place], Regina W, NP              Passed - Completed PHQ-2 or PHQ-9 in the last 360 days         Requested Prescriptions  Pending Prescriptions Disp Refills   escitalopram (LEXAPRO) 10 MG tablet [Pharmacy Med Name: ESCITALOPRAM 10 MG TABLET] 90 tablet 0    Sig: TAKE 1 [place]     Psychiatry:  Antidepressants - SSRI Failed - 06/25/2021  9:19 AM      Failed - Valid encounter within last 6 months    Recent Outpatient Visits           8 months ago Encounter for general adult medical examination with abnormal findings   [facility] [place], Coralie Keens, NP              Passed - Completed PHQ-2 or PHQ-9 in the last 360 days

## 2021-07-05 ENCOUNTER — Other Ambulatory Visit: Payer: Self-pay

## 2021-07-05 ENCOUNTER — Ambulatory Visit
Admission: RE | Admit: 2021-07-05 | Discharge: 2021-07-05 | Disposition: A | Discharge status: Home / Self Care | Payer: BC Managed Care – PPO | Source: Ambulatory Visit | Attending: Internal Medicine | Admitting: Internal Medicine

## 2021-07-05 DIAGNOSIS — Z1231 Encounter for screening mammogram for malignant neoplasm of breast: Secondary | ICD-10-CM | POA: Insufficient documentation

## 2021-07-12 ENCOUNTER — Ambulatory Visit: Payer: BC Managed Care – PPO | Admitting: Psychology

## 2021-07-12 DIAGNOSIS — F3289 Other specified depressive episodes: Secondary | ICD-10-CM | POA: Diagnosis not present

## 2021-07-12 DIAGNOSIS — F418 Other specified anxiety disorders: Secondary | ICD-10-CM

## 2021-07-12 NOTE — Progress Notes (Signed)
[place] Counselor/Therapist Progress Note  Patient ID: Rachael Holloway, MRN: [id number]    Date: 07/12/21  Time Spent: 3:33 pm - 4:18 pm : 45 Minutes  Treatment Type: Individual Therapy.  Reported Symptoms: increased depressive symptoms, intrusive thoughts (negative self-talk), low motivation, grief.    Mental Status Exam: Appearance:  Fairly Groomed     Behavior: Appropriate  Motor: Normal  Speech/Language:  Clear and Coherent  Affect: Tearful  Mood: depressed and sad  Thought process: normal  Thought content:   WNL  Sensory/Perceptual disturbances:   WNL  Orientation: oriented to person, place, time/date, and situation  Attention: Good  Concentration: Good  Memory: WNL  Fund of knowledge:  Good  Insight:   Good  Judgment:  Good  Impulse Control: Good   Risk Assessment: Danger to Self:  No Self-injurious Behavior: No Danger to Others: No Duty to Warn:no Physical Aggression / Violence:No  Access to Firearms a concern: No  Gang Involvement:No   Subjective:   Rachael Holloway participated from home, via video, and consented to treatment. Therapist participated from home office. We met online due to Rachael Holloway. Rachael Holloway reviewed the events of the past week.  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 She noted having less control over her schedule and process, as a result. She noted being overwhelmed by transition from work to home. We explored her coping during the session and worked on identifying additional healthful coping skills and tools including addressing barriers to engagement, employing BA principles. She noted her worry regarding her husband's recent depression due to an unexpected layoff. She noted additional worry regarding her uncle Rachael Holloway who is in poor health. We began processing this. She seems engaged and motivated during the session and expressed commitment towards her goals.  We scheduled a follow-up appointment for  continued treatment. Therapist provided supportive therapy and praised Rachael Holloway for her effort.  Interventions:  Behavioral Activation  Diagnosis:  Other depression  Other specified anxiety disorders  Treatment Plan:  Client Abilities/Strengths Rachael Holloway is intelligent, forthcoming, and motivated for change.   Client Treatment Preferences Outpatient Therapy.   Client Statement of Needs Rachael Holloway discussed her goals for treatment including continuing journaling, continued consistent physical exercises, boundaries with self for work, actively managing stressors, and managing overall mood.  Treatment Level Weekly  Symptoms Depression: Loss of interest, feeling down, lot motivation, lethargy, mild hypersomnia, feeling bad about self.  (Status: maintained)  Anxiety: Difficulty concentrating, somatic complaints (nausea, loose bowels), anticipatory anxiety, muscle tension, consistent worry (Rachael Holloway's health, green-card, bureaucracy), anticipatory anxiety, (Status: maintained)   Goals:  Rachael Holloway experiences symptoms of of depression and anxiety.   Target Date: 02/17/22 Frequency: Weekly  Progress: 0 Modality: individual    Therapist will provide referrals for additional resources as appropriate.  Therapist will provide psycho-education regarding Rachael Holloway's diagnosis and corresponding treatment approaches and interventions. Rachael Holloway will employ CBT, BA, Problem-solving, Solution Focused, Mindfulness, and coping skills to help manage decrease symptoms associated with her diagnosis.   Reduce overall level, frequency, and intensity of the feelings of depression and anxiety evidenced by decreased symptomology from 6 to 7 days/week to 0 to 1 days/week per client report for at least 3 consecutive months. Verbally express understanding of the relationship between feelings of depression, anxiety and their impact on thinking patterns and behaviors. Verbalize an understanding of the role that distorted  thinking plays in creating fears, excessive worry, and ruminations.  Rachael Holloway participated in the creation of the treatment plan)   Rachael Holloway  Rachael Alonge, LCSW

## 2021-09-04 ENCOUNTER — Ambulatory Visit (INDEPENDENT_AMBULATORY_CARE_PROVIDER_SITE_OTHER): Payer: BC Managed Care – PPO | Admitting: Psychology

## 2021-09-04 DIAGNOSIS — F3289 Other specified depressive episodes: Secondary | ICD-10-CM | POA: Diagnosis not present

## 2021-09-04 DIAGNOSIS — F418 Other specified anxiety disorders: Secondary | ICD-10-CM | POA: Diagnosis not present

## 2021-09-04 NOTE — Progress Notes (Signed)
[place] Counselor/Therapist Progress Note ? ?Patient ID: Rachael Holloway, MRN: [id number]   ? ?Date: 09/04/21 ? ?Time Spent: 3:03 pm - 3:50 pm :47 Minutes ? ?Treatment Type: Individual Therapy. ? ?Reported Symptoms: increased depressive symptoms, intrusive thoughts (negative self-talk), low motivation, grief.   ? ?Mental Status Exam: ?Appearance:  Neat and Well Groomed     ?Behavior: Appropriate  ?Motor: Normal  ?Speech/Language:  Clear and Coherent  ?Affect: Depressed  ?Mood: depressed and sad  ?Thought process: normal  ?Thought content:   WNL  ?Sensory/Perceptual disturbances:   WNL  ?Orientation: oriented to person, place, time/date, and situation  ?Attention: Good  ?Concentration: Good  ?Memory: WNL  ?Fund of knowledge:  Good  ?Insight:   Good  ?Judgment:  Good  ?Impulse Control: Good  ? ?Risk Assessment: ?Danger to Self:  No ?Self-injurious Behavior: No ?Danger to Others: No ?Duty to Warn:no ?Physical Aggression / Violence:No  ?Access to Firearms a concern: No  ?Gang Involvement:No  ? ?Subjective:  ? ?Rachael Holloway participated from home, via video, and consented to treatment. Therapist participated from home office. We met online due to [place] pandemic. Rachael Holloway reviewed the events of the past week. Rachael Holloway noted a recent increase in her overall sleep, sleeping around 9 hours at night & a 2 hour nap. She noted her increase in her Lexapro 93m to 258mqd. Rachael Holloway to walk with a friend 3x (5k) a week.  She discussed difficulty managing mood noted contributing factors include her husband's own depression that he was recently went off.  We explored this during the session.  Therapist provided psychoeducation regarding anxiety and depression and the management thereof.  We worked on reviewing self-care and positive thinking.  Rachael Holloway difficulty thinking positively and we discussed ways to become more engaged in positive thoughts and self-talk and to become more proactive in this  area.  Therapist modeled this during the session.  Worked on identifying positives coming up in her schedule to work on focusing on those as she manages her current stressors.  She seems engaged and motivated during the session and expressed commitment towards her goals.  Therapist praised and a follow-up was scheduled. ? ?Interventions: Cognitive Behavioral Therapy and Psycho-education/Bibliotherapy ? ?Diagnosis:  ?No diagnosis found. ? ?Treatment Plan: ? ?Client Abilities/Strengths ?Rachael Holloway intelligent, forthcoming, and motivated for change.  ? ?Client Treatment Preferences ?Outpatient Therapy.  ? ?Client Statement of Needs ?Rachael Holloway her goals for treatment including continuing journaling, continued consistent physical exercises, boundaries with self for work, actively managing stressors, and managing overall mood. ? ?Treatment Level ?Weekly ? ?Symptoms ?Depression: Loss of interest, feeling down, lot motivation, lethargy, mild hypersomnia, feeling bad about self.  (Status: maintained) ? ?Anxiety: Difficulty concentrating, somatic complaints (nausea, loose bowels), anticipatory anxiety, muscle tension, consistent worry (Rachael Holloway's health, green-card, bureaucracy), anticipatory anxiety, (Status: maintained) ? ? ?Goals:  ?Rachael Holloway symptoms of of depression and anxiety.  ? ?Target Date: 02/17/22 Frequency: Weekly  ?Progress: 0 Modality: individual  ? ? ?Therapist will provide referrals for additional resources as appropriate.  ?Therapist will provide psycho-education regarding Rachael Holloway's diagnosis and corresponding treatment approaches and interventions. ?Rachael Holloway employ CBT, BA, Problem-solving, Solution Focused, Mindfulness, and coping skills to help manage decrease symptoms associated with her diagnosis.  ? Reduce overall level, frequency, and intensity of the feelings of depression and anxiety evidenced by decreased symptomology from 6 to 7 days/week to 0 to 1 days/week per client report for  at least 3 consecutive months. ?Verbally express understanding  of the relationship between feelings of depression, anxiety and their impact on thinking patterns and behaviors. ?Verbalize an understanding of the role that distorted thinking plays in creating fears, excessive worry, and ruminations. ? ?(Rachael Holloway participated in the creation of the treatment plan) ? ? ?Rachael Irish, LCSW ?

## 2021-09-18 ENCOUNTER — Encounter: Payer: Self-pay | Admitting: Internal Medicine

## 2021-09-19 MED ORDER — ESCITALOPRAM OXALATE 20 MG PO TABS: 20.0000 mg | ORAL_TABLET | Freq: Every day | ORAL | 0 refills | Status: DC

## 2021-09-21 ENCOUNTER — Other Ambulatory Visit: Payer: Self-pay | Admitting: Internal Medicine

## 2021-09-21 NOTE — Telephone Encounter (Signed)
Change in therapy. Discontinued 09/18/21 ?Requested Prescriptions  ?Pending Prescriptions Disp Refills  ?? escitalopram (LEXAPRO) 10 MG tablet [Pharmacy Med Name: ESCITALOPRAM 10 MG TABLET] 90 tablet 0  ?  Sig: TAKE 1 TABLET BY MOUTH EVERY DAY  ?  ? Psychiatry:  Antidepressants - SSRI Failed - 09/21/2021  2:53 AM  ?  ?  Failed - Valid encounter within last 6 months  ?  Recent Outpatient Visits   ?      ? 11 months ago Encounter for general adult medical examination with abnormal findings  ? [facility] [place], Coralie Keens, NP  ?  ?  ?Future Appointments   ?        ? In 4 weeks Baity, Coralie Keens, NP [facility], [place]  ?  ? ?  ?  ?  Passed - Completed PHQ-2 or PHQ-9 in the last 360 days  ?  ?  ? ? ?

## 2021-09-25 ENCOUNTER — Other Ambulatory Visit: Payer: Self-pay | Admitting: Internal Medicine

## 2021-09-25 DIAGNOSIS — E039 Hypothyroidism, unspecified: Secondary | ICD-10-CM

## 2021-09-26 NOTE — Telephone Encounter (Signed)
Requested Prescriptions  ?Pending Prescriptions Disp Refills  ?? levothyroxine (SYNTHROID) 112 MCG tablet [Pharmacy Med Name: LEVOTHYROXINE 112 MCG TABLET] 90 tablet 0  ?  Sig: TAKE 1 TABLET BY MOUTH EVERY DAY  ?  ? Endocrinology:  Hypothyroid Agents Passed - 09/25/2021  2:34 AM  ?  ?  Passed - TSH in normal range and within 360 days  ?  TSH  ?Date Value Ref Range Status  ?10/13/2020 1.960 0.450 - 4.500 uIU/mL Final  ?   ?  ?  Passed - Valid encounter within last 12 months  ?  Recent Outpatient Visits   ?      ? 11 months ago Encounter for general adult medical examination with abnormal findings  ? [facility] [place], Coralie Keens, NP  ?  ?  ?Future Appointments   ?        ? In 3 weeks Baity, Coralie Keens, NP [facility] [place]  ?  ? ?  ?  ?  ? ?

## 2021-10-17 DIAGNOSIS — L239 Allergic contact dermatitis, unspecified cause: Secondary | ICD-10-CM | POA: Diagnosis not present

## 2021-10-17 DIAGNOSIS — D2261 Melanocytic nevi of right upper limb, including shoulder: Secondary | ICD-10-CM | POA: Diagnosis not present

## 2021-10-19 ENCOUNTER — Ambulatory Visit (INDEPENDENT_AMBULATORY_CARE_PROVIDER_SITE_OTHER): Payer: BC Managed Care – PPO | Admitting: Internal Medicine

## 2021-10-19 ENCOUNTER — Encounter: Payer: Self-pay | Admitting: Internal Medicine

## 2021-10-19 VITALS — BP 134/82 | HR 83 | Temp 96.9°F | Ht 62.0 in | Wt 145.0 lb

## 2021-10-19 DIAGNOSIS — E785 Hyperlipidemia, unspecified: Secondary | ICD-10-CM | POA: Insufficient documentation

## 2021-10-19 DIAGNOSIS — E039 Hypothyroidism, unspecified: Secondary | ICD-10-CM | POA: Diagnosis not present

## 2021-10-19 DIAGNOSIS — E782 Mixed hyperlipidemia: Secondary | ICD-10-CM

## 2021-10-19 DIAGNOSIS — F419 Anxiety disorder, unspecified: Secondary | ICD-10-CM

## 2021-10-19 DIAGNOSIS — F32A Depression, unspecified: Secondary | ICD-10-CM

## 2021-10-19 DIAGNOSIS — Z0001 Encounter for general adult medical examination with abnormal findings: Secondary | ICD-10-CM

## 2021-10-19 DIAGNOSIS — Z6826 Body mass index (BMI) 26.0-26.9, adult: Secondary | ICD-10-CM

## 2021-10-19 DIAGNOSIS — E663 Overweight: Secondary | ICD-10-CM | POA: Diagnosis not present

## 2021-10-19 MED ORDER — BUPROPION HCL ER (XL) 150 MG PO TB24: 150.0000 mg | ORAL_TABLET | Freq: Every day | ORAL | 1 refills | Status: DC

## 2021-10-19 NOTE — Assessment & Plan Note (Signed)
Encourage diet and exercise for weight loss 

## 2021-10-19 NOTE — Patient Instructions (Signed)

## 2021-10-19 NOTE — Assessment & Plan Note (Signed)
Persistent Continue Escitalopram at current dose We will add Wellbutrin 150 mg daily Encouraged her to continue to see her therapist Support offered  Would consider Adderall or similar med as a stimulant if symptoms persist or worsen

## 2021-10-19 NOTE — Progress Notes (Signed)
Subjective:    Patient ID: Rachael Holloway, female    DOB: [birth year], 45 y.o.   MRN: [id number]  HPI  Patient presents to clinic today for her annual exam.    She also reports persistent fatigue and depression.  This has been a chronic issue for her.  She reports her anxiety seems to be better but she just feels "blah and blue all the time".  She is currently seeing a therapist.  She is taking escitalopram as prescribed.  She denies SI/HI.  Flu: 02/2021 Tetanus: 09/2020 COVID: Pfizer x3 Pap smear: 08/2019 Mammogram: 06/2021 Colon screening: 11/2019 Vision screening: annually Dentist: biannually  Diet: She does eat lean meat. She consumes fruits and veggies. She tries to avoid fried foods. She drinks mostly hot tea, water. Exercise: Walking  Review of Systems     Past Medical History:  Diagnosis Date   Allergy    Chicken pox    Depression    Migraines    Thyroid disease    White coat hypertension     Current Outpatient Medications  Medication Sig Dispense Refill   Cholecalciferol (VITAMIN D3) 2000 UNITS TABS Take 2 capsules by mouth. 2 times a week     escitalopram (LEXAPRO) 20 MG tablet Take 1 tablet (20 mg total) by mouth daily. 90 tablet 0   Flaxseed, Linseed, (FLAXSEED OIL PO) Take 1 capsule by mouth 2 (two) times daily.     Folate-B12-Intrinsic Factor (INTRINSI B12-FOLATE) 325-498-26 MCG-MCG-MG TABS Take 1 capsule by mouth daily.     ibuprofen (ADVIL,MOTRIN) 400 MG tablet Take 400 mg by mouth as needed.     levothyroxine (SYNTHROID) 112 MCG tablet TAKE 1 TABLET BY MOUTH EVERY DAY 90 tablet 0   loratadine (CLARITIN) 10 MG tablet Take 10 mg by mouth daily.     NIACINAMIDE-ZINC-COPPER-FA PO Take 1 capsule by mouth daily.     No current facility-administered medications for this visit.    Allergies  Allergen Reactions   Dust Mite Mixed Allergen Ext [Mite (D. Farinae)]    Mold Extract [Trichophyton]    Pollen Extract     Family History  Problem Relation Age of  Onset   Heart disease Father    Hypertension Father    Stroke Maternal Grandmother    Stroke Paternal Grandfather    Breast cancer Cousin    Cancer Neg Hx    Diabetes Neg Hx     Social History   Socioeconomic History   Marital status: Married    Spouse name: Not on file   Number of children: Not on file   Years of education: Not on file   Highest education level: Not on file  Occupational History   Not on file  Tobacco Use   Smoking status: Never   Smokeless tobacco: Never  Vaping Use   Vaping Use: Never used  Substance and Sexual Activity   Alcohol use: Yes    Alcohol/week: 5.0 standard drinks    Types: 5 Glasses of wine per week    Comment: moderate   Drug use: No   Sexual activity: Yes  Other Topics Concern   Not on file  Social History Narrative   Not on file   Social Determinants of Health   Financial Resource Strain: Not on file  Food Insecurity: Not on file  Transportation Needs: Not on file  Physical Activity: Not on file  Stress: Not on file  Social Connections: Not on file  Intimate Partner Violence: Not on file  Constitutional: Patient reports intermittent headaches.  Denies fever, malaise, fatigue, or abrupt weight changes.  HEENT: Denies eye pain, eye redness, ear pain, ringing in the ears, wax buildup, runny nose, nasal congestion, bloody nose, or sore throat. Respiratory: Denies difficulty breathing, shortness of breath, cough or sputum production.   Cardiovascular: Denies chest pain, chest tightness, palpitations or swelling in the hands or feet.  Gastrointestinal: Denies abdominal pain, bloating, constipation, diarrhea or blood in the stool.  GU: Patient reports decreased libido.  Denies urgency, frequency, pain with urination, burning sensation, blood in urine, odor or discharge. Musculoskeletal: Patient reports intermittent right ankle pain.  Denies decrease in range of motion, difficulty with gait, muscle pain or joint swelling.  Skin:  Denies redness, rashes, lesions or ulcercations.  Neurological: Denies dizziness, difficulty with memory, difficulty with speech or problems with balance and coordination.  Psych: Patient has a history of anxiety and depression.  Denies SI/HI.  No other specific complaints in a complete review of systems (except as listed in HPI above).  Objective:   Physical Exam  BP 134/82 (BP Location: Left Arm, Patient Position: Sitting, Cuff Size: Normal)   Pulse 83   Temp (!) 96.9 F (36.1 C) (Temporal)   Ht $R'5\' 2"'aK$  (1.575 m)   Wt 145 lb (65.8 kg)   SpO2 99%   BMI 26.52 kg/m   Wt Readings from Last 3 Encounters:  10/07/20 140 lb 6.4 oz (63.7 kg)  11/27/19 135 lb (61.2 kg)  09/09/19 135 lb (61.2 kg)    General: Appears her stated age, overweight, in NAD. Skin: Warm, dry and intact.  HEENT: Head: normal shape and size; Eyes: sclera white, PERRLA and EOMs intact;  Neck:  Neck supple, trachea midline. No masses, lumps or thyromegaly present.  Cardiovascular: Normal rate and rhythm. S1,S2 noted.  No murmur, rubs or gallops noted. No JVD or BLE edema.  Pulmonary/Chest: Normal effort and positive vesicular breath sounds. No respiratory distress. No wheezes, rales or ronchi noted.  Abdomen: Normal bowel sounds.  Musculoskeletal: Strength 5/5 BUE/BLE.  No difficulty with gait.  Neurological: Alert and oriented. Cranial nerves II-XII grossly intact. Coordination normal.  Psychiatric: Mood and affect mildly. Behavior is normal. Judgment and thought content normal.    BMET    Component Value Date/Time   NA 139 10/13/2020 0807   K 4.2 10/13/2020 0807   CL 103 10/13/2020 0807   CO2 21 10/13/2020 0807   GLUCOSE 98 10/13/2020 0807   GLUCOSE 86 09/09/2019 1541   BUN 8 10/13/2020 0807   CREATININE 0.70 10/13/2020 0807   CREATININE 0.86 04/10/2017 1547   CALCIUM 9.0 10/13/2020 0807   GFRNONAA 86 11/06/2018 1433   GFRAA 99 11/06/2018 1433    Lipid Panel     Component Value Date/Time   CHOL  216 (H) 10/13/2020 0807   TRIG 172 (H) 10/13/2020 0807   HDL 56 10/13/2020 0807   CHOLHDL 3.9 10/13/2020 0807   CHOLHDL 3 10/08/2019 1023   VLDL 34.6 10/08/2019 1023   LDLCALC 130 (H) 10/13/2020 0807   LDLCALC 97 04/10/2017 1547    CBC    Component Value Date/Time   WBC 7.0 10/13/2020 0807   WBC 7.6 09/09/2019 1541   RBC 4.49 10/13/2020 0807   RBC 4.56 09/09/2019 1541   HGB 14.1 10/13/2020 0807   HCT 42.6 10/13/2020 0807   PLT 280 10/13/2020 0807   MCV 95 10/13/2020 0807   MCH 31.4 10/13/2020 0807   MCH 31.1 04/10/2017 1547   MCHC  33.1 10/13/2020 0807   MCHC 34.0 09/09/2019 1541   RDW 11.8 10/13/2020 0807   LYMPHSABS 2.2 10/13/2020 0807   EOSABS 0.2 10/13/2020 0807   BASOSABS 0.1 10/13/2020 0807    Hgb A1C No results found for: HGBA1C          Assessment & Plan:   Preventative Health Maintenance:  Encouraged her to get a flu shot in the fall Tetanus UTD Encouraged her to get her COVID booster Pap smear UTD Mammogram UTD Colon screening UTD Encouraged her to consume a balanced diet and exercise regimen Advised her to see an eye doctor and dentist annually We will check CBC, c-Met, TSH, free T4 and lipid profile today  RTC in 6 months, follow-up chronic conditions Webb Silversmith, NP

## 2021-10-20 ENCOUNTER — Encounter: Payer: Self-pay | Admitting: Internal Medicine

## 2021-10-20 DIAGNOSIS — E039 Hypothyroidism, unspecified: Secondary | ICD-10-CM

## 2021-10-20 LAB — COMPLETE METABOLIC PANEL WITH GFR
AG Ratio: 2 (calc) (ref 1.0–2.5)
ALT: 10 U/L (ref 6–29)
AST: 18 U/L (ref 10–35)
Albumin: 4.1 g/dL (ref 3.6–5.1)
Alkaline phosphatase (APISO): 81 U/L (ref 31–125)
BUN: 11 mg/dL (ref 7–25)
CO2: 24 mmol/L (ref 20–32)
Calcium: 9 mg/dL (ref 8.6–10.2)
Chloride: 103 mmol/L (ref 98–110)
Creat: 0.72 mg/dL (ref 0.50–0.99)
Globulin: 2.1 g/dL (calc) (ref 1.9–3.7)
Glucose, Bld: 91 mg/dL (ref 65–99)
Potassium: 4 mmol/L (ref 3.5–5.3)
Sodium: 138 mmol/L (ref 135–146)
Total Bilirubin: 0.5 mg/dL (ref 0.2–1.2)
Total Protein: 6.2 g/dL (ref 6.1–8.1)
eGFR: 104 mL/min/{1.73_m2} (ref >=60)

## 2021-10-20 LAB — CBC
HCT: 41.4 % (ref 35.0–45.0)
Hemoglobin: 14 g/dL (ref 11.7–15.5)
MCH: 32 pg (ref 27.0–33.0)
MCHC: 33.8 g/dL (ref 32.0–36.0)
MCV: 94.7 fL (ref 80.0–100.0)
MPV: 10.7 fL (ref 7.5–12.5)
Platelets: 258 10*3/uL (ref 140–400)
RBC: 4.37 10*6/uL (ref 3.80–5.10)
RDW: 12.1 % (ref 11.0–15.0)
WBC: 7.4 10*3/uL (ref 3.8–10.8)

## 2021-10-20 LAB — LIPID PANEL
Cholesterol: 215 mg/dL — ABNORMAL HIGH (ref <200)
HDL: 51 mg/dL (ref >=50)
LDL Cholesterol (Calc): 117 mg/dL (calc) — ABNORMAL HIGH
Non-HDL Cholesterol (Calc): 164 mg/dL (calc) — ABNORMAL HIGH (ref <130)
Total CHOL/HDL Ratio: 4.2 (calc) (ref <5.0)
Triglycerides: 332 mg/dL — ABNORMAL HIGH (ref <150)

## 2021-10-20 LAB — TSH+FREE T4
Free T4: 1.1 ng/dL (ref 0.8–1.8)
TSH: 4.67 mIU/L — ABNORMAL HIGH

## 2021-10-20 MED ORDER — LEVOTHYROXINE SODIUM 125 MCG PO TABS: 125.0000 ug | ORAL_TABLET | Freq: Every day | ORAL | 0 refills | Status: DC

## 2021-10-20 MED ORDER — ATORVASTATIN CALCIUM 10 MG PO TABS: 10.0000 mg | ORAL_TABLET | Freq: Every day | ORAL | 1 refills | Status: DC

## 2021-11-02 ENCOUNTER — Ambulatory Visit (INDEPENDENT_AMBULATORY_CARE_PROVIDER_SITE_OTHER): Payer: BC Managed Care – PPO | Admitting: Psychology

## 2021-11-02 DIAGNOSIS — F3289 Other specified depressive episodes: Secondary | ICD-10-CM

## 2021-11-02 NOTE — Progress Notes (Signed)
[place] Counselor/Therapist Progress Note  Patient ID: Rachael Holloway, MRN: [id number]    Date: 11/02/21  Time Spent: 10:03 am - 10:47 am :  44 Minutes  Treatment Type: Individual Therapy.  Reported Symptoms: increased depressive symptoms, intrusive thoughts (negative self-talk), low motivation, grief.    Mental Status Exam: Appearance:  Neat and Well Groomed     Behavior: Appropriate  Motor: Normal  Speech/Language:  Clear and Coherent  Affect: Depressed  Mood: dysthymic  Thought process: normal  Thought content:   WNL  Sensory/Perceptual disturbances:   WNL  Orientation: oriented to person, place, time/date, and situation  Attention: Good  Concentration: Good  Memory: WNL  Fund of knowledge:  Good  Insight:   Good  Judgment:  Good  Impulse Control: Good   Risk Assessment: Danger to Self:  No Self-injurious Behavior: No Danger to Others: No Duty to Warn:no Physical Aggression / Violence:No  Access to Firearms a concern: No  Gang Involvement:No   Subjective:   Berline Lopes participated from home, via video, and consented to treatment. Therapist participated from home office. We met online due to [place] pandemic. Storey reviewed the events of the past week. Oriel noted her stressors including her health, a recent visit with her mother, and job stressors. She noted her thyroid is "slow" during a recent annual check-up. She noted frustration regarding not being able to "control" this issue since the onset in 1999. She noted doing what she need to do "to get through life", I.e. taking her medication to no avail. She noted concerns regarding her lipid panel and having to be prescribed a statin. She noted a family history of significant cardiological issues. She noted her long-held hope that she would have get the "good" genes. She noted the "physical frailty" of someone she loves, while visiting her mother (61). She noted work related stressors, due to  travel, and having to complete specific tasks that are due tomorrow. She noted that processing during the session has been helpful. We reviewed coping skills, during the session, including engagement in self-care. She seems engaged and motivated during the session and expressed commitment towards her goals.  Therapist praised and a follow-up was scheduled.  Interventions: Cognitive Behavioral Therapy  Diagnosis:  Other depression  Treatment Plan:  Client Abilities/Strengths Joliet is intelligent, forthcoming, and motivated for change.   Client Treatment Preferences Outpatient Therapy.   Client Statement of Needs Shameka discussed her goals for treatment including continuing journaling, continued consistent physical exercises, boundaries with self for work, actively managing stressors, and managing overall mood.  Treatment Level Weekly  Symptoms Depression: Loss of interest, feeling down, lot motivation, lethargy, mild hypersomnia, feeling bad about self.  (Status: maintained)  Anxiety: Difficulty concentrating, somatic complaints (nausea, loose bowels), anticipatory anxiety, muscle tension, consistent worry ([name]'s health, green-card, bureaucracy), anticipatory anxiety, (Status: maintained)   Goals:  Perry experiences symptoms of of depression and anxiety.   Target Date: 02/17/22 Frequency: Weekly  Progress: 5% Modality: individual    Therapist will provide referrals for additional resources as appropriate.  Therapist will provide psycho-education regarding Margaretmary's diagnosis and corresponding treatment approaches and interventions. Naidelin will employ CBT, BA, Problem-solving, Solution Focused, Mindfulness, and coping skills to help manage decrease symptoms associated with her diagnosis.   Reduce overall level, frequency, and intensity of the feelings of depression and anxiety evidenced by decreased symptomology from 6 to 7 days/week to 0 to 1 days/week per client report for  at least 3 consecutive months. Verbally express  understanding of the relationship between feelings of depression, anxiety and their impact on thinking patterns and behaviors. Verbalize an understanding of the role that distorted thinking plays in creating fears, excessive worry, and ruminations.  [name] participated in the creation of the treatment plan)   [name], LCSW

## 2021-11-22 ENCOUNTER — Encounter: Payer: Self-pay | Admitting: Internal Medicine

## 2021-11-27 ENCOUNTER — Other Ambulatory Visit: Payer: BC Managed Care – PPO

## 2021-11-27 DIAGNOSIS — E039 Hypothyroidism, unspecified: Secondary | ICD-10-CM

## 2021-11-28 LAB — TSH+FREE T4
Free T4: 1.41 ng/dL (ref 0.82–1.77)
TSH: 0.458 u[IU]/mL (ref 0.450–4.500)

## 2021-12-12 ENCOUNTER — Other Ambulatory Visit: Payer: Self-pay | Admitting: Internal Medicine

## 2021-12-13 NOTE — Telephone Encounter (Signed)
Requested Prescriptions  Pending Prescriptions Disp Refills  . escitalopram (LEXAPRO) 20 MG tablet [Pharmacy Med Name: ESCITALOPRAM 20 MG TABLET] 90 tablet 0    Sig: TAKE 1 TABLET BY MOUTH EVERY DAY     Psychiatry:  Antidepressants - SSRI Passed - 12/12/2021  2:07 AM      Passed - Completed PHQ-2 or PHQ-9 in the last 360 days      Passed - Valid encounter within last 6 months    Recent Outpatient Visits          1 month ago Encounter for general adult medical examination with abnormal findings   [place], NP   1 year ago Encounter for general adult medical examination with abnormal findings   [facility] [place], Coralie Keens, NP

## 2021-12-16 IMAGING — MG MM DIGITAL SCREENING BILAT W/ TOMO AND CAD
8 series · 9 of 24 positions shown · non-contrast
Comparison: Previous exam(s).

CLINICAL DATA: Screening.

EXAM:
DIGITAL SCREENING BILATERAL MAMMOGRAM WITH TOMO AND CAD

[L CC synth-2D]
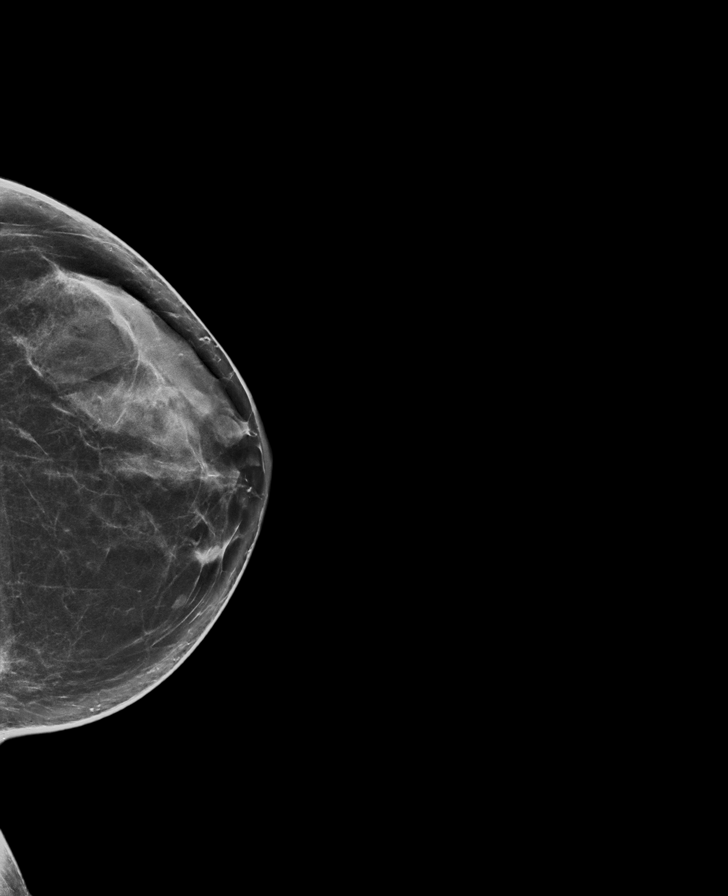

[L MLO synth-2D]
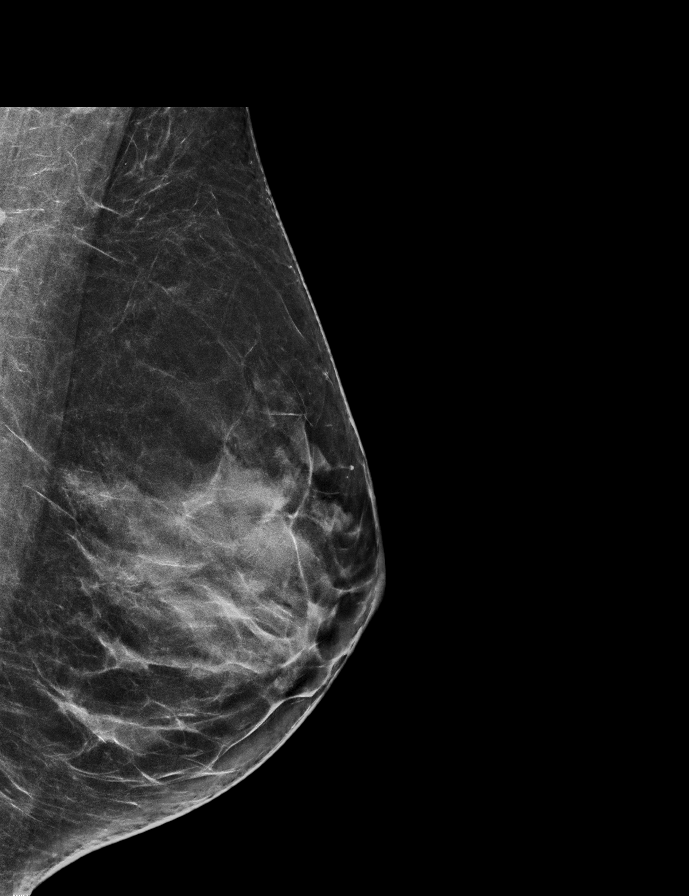

[R MLO synth-2D]
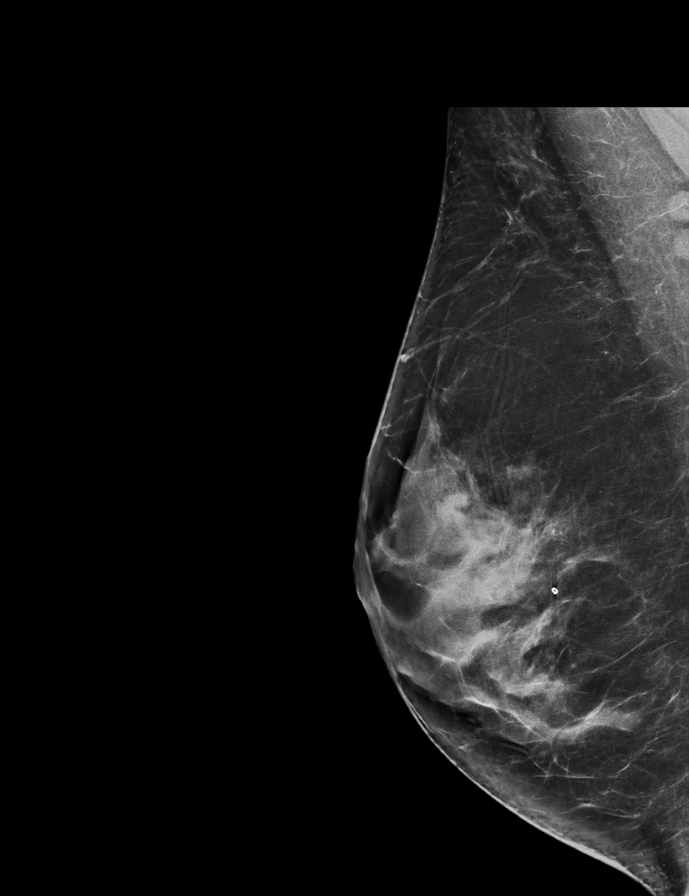

[R CC synth-2D]
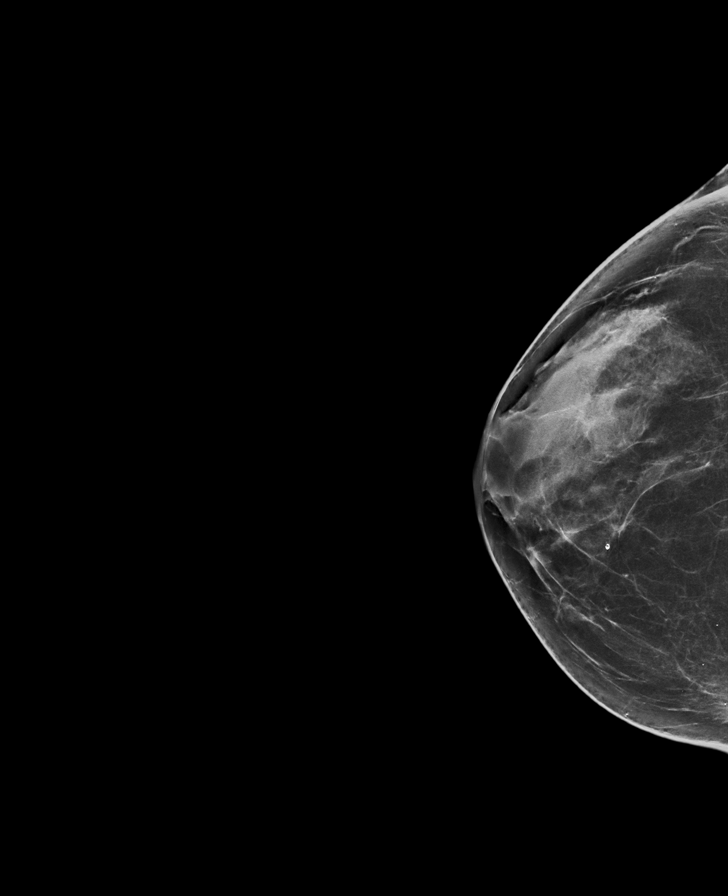

[R MLO tomo · 2 of 73 frames shown]
[frame 24/73]
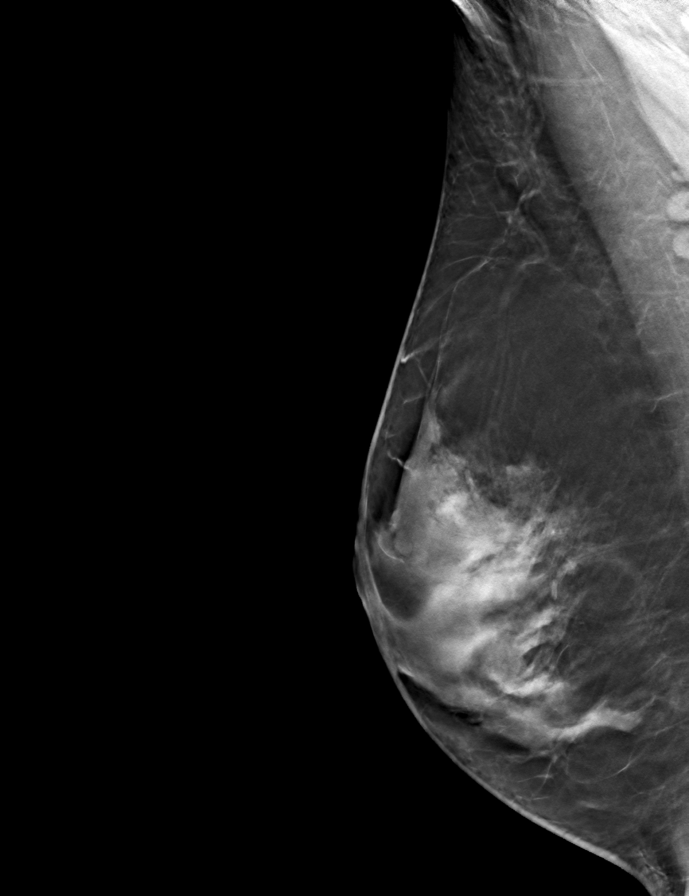
[frame 37/73]
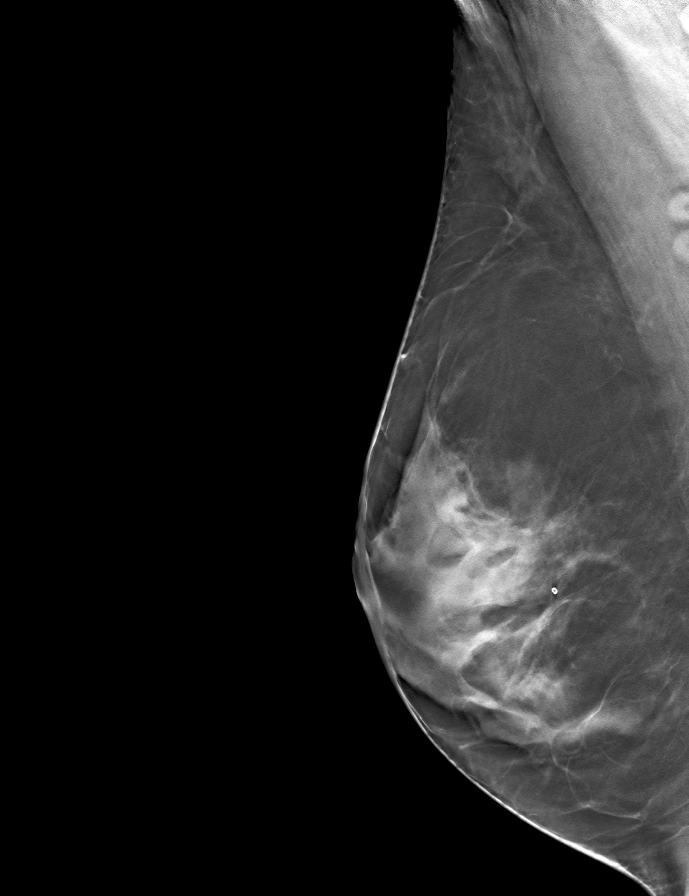

[L MLO tomo · tomo slice 33/66.0]
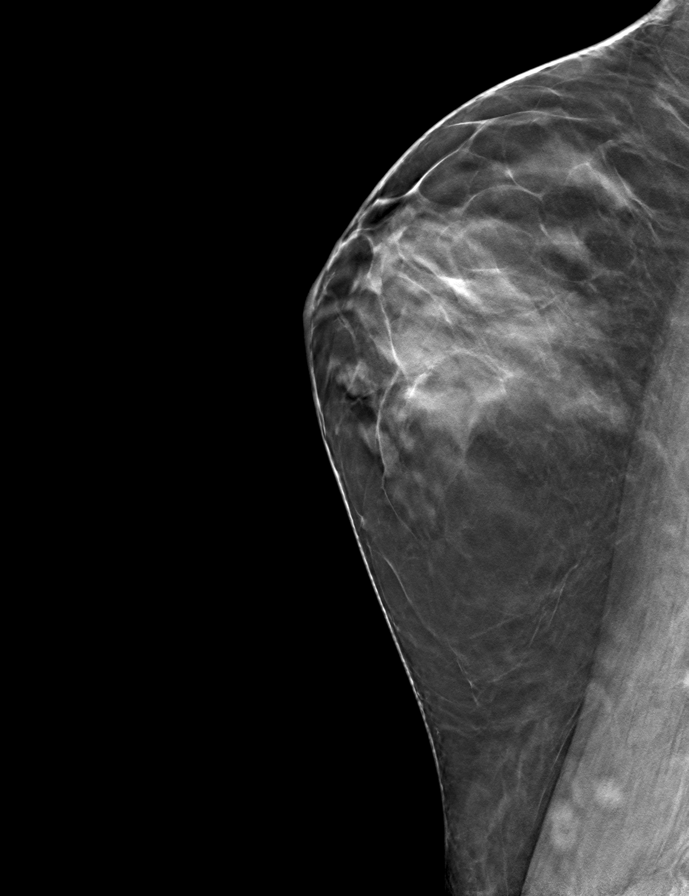

[L CC tomo · tomo slice 37/73.0]
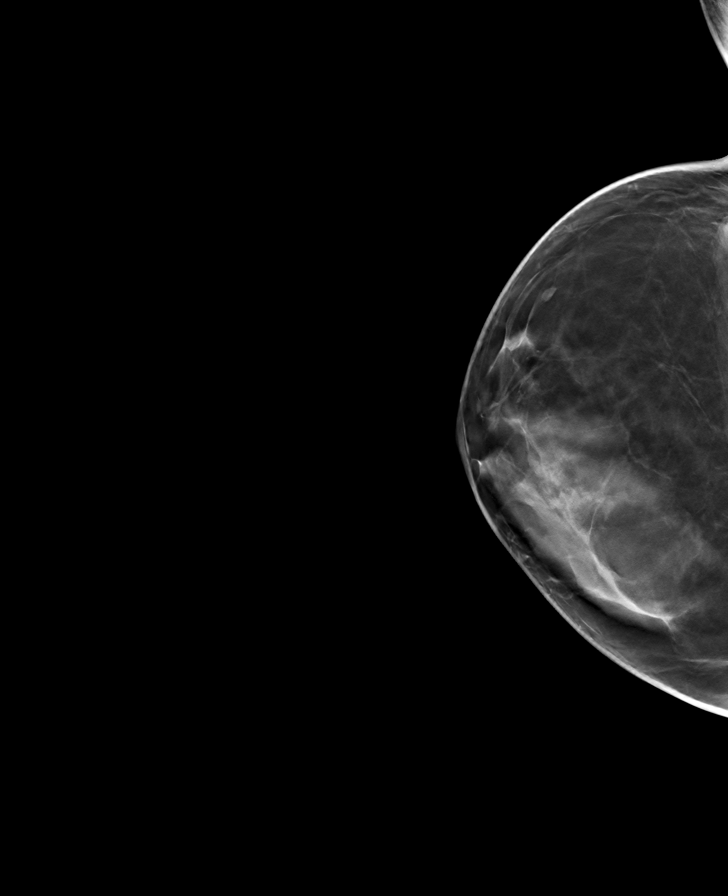

[R CC tomo · tomo slice 37/73.0]
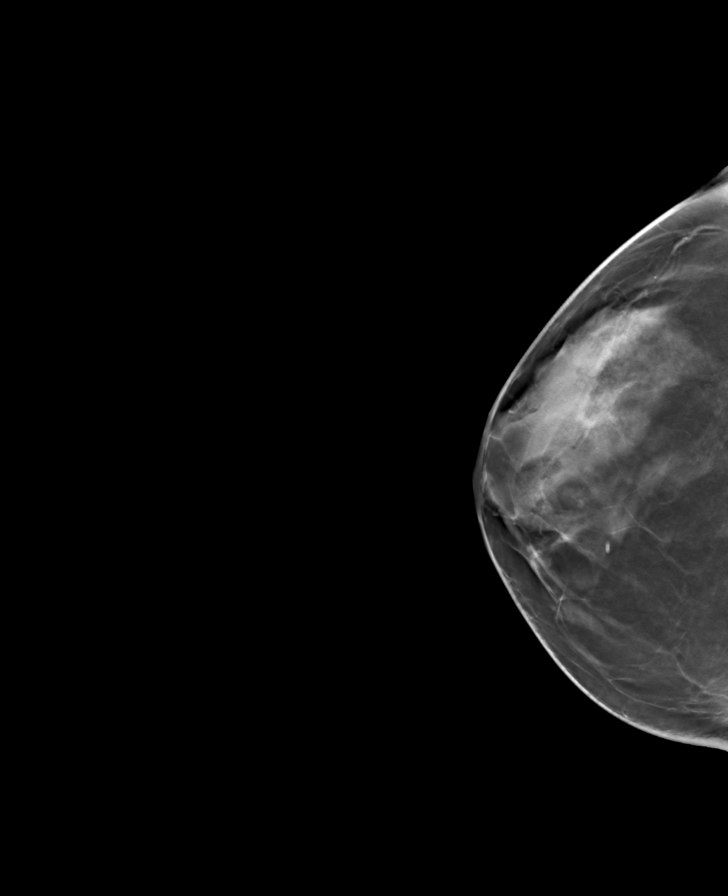

[9 of 24 positions shown; findings below may reference images not displayed]

ACR Breast Density Category d: The breast tissue is extremely dense,
which lowers the sensitivity of mammography
FINDINGS: There are no findings suspicious for malignancy. The images were
evaluated with computer-aided detection.
IMPRESSION: No mammographic evidence of malignancy. A result letter of this
screening mammogram will be mailed directly to the patient.

RECOMMENDATION:
Screening mammogram in one year. (Code:S5-Y-XW9)

BI-RADS CATEGORY  1: Negative.

## 2021-12-24 ENCOUNTER — Other Ambulatory Visit: Payer: Self-pay | Admitting: Internal Medicine

## 2021-12-24 DIAGNOSIS — E039 Hypothyroidism, unspecified: Secondary | ICD-10-CM

## 2021-12-25 ENCOUNTER — Ambulatory Visit (INDEPENDENT_AMBULATORY_CARE_PROVIDER_SITE_OTHER): Payer: BC Managed Care – PPO | Admitting: Psychology

## 2021-12-25 DIAGNOSIS — F3289 Other specified depressive episodes: Secondary | ICD-10-CM

## 2021-12-25 DIAGNOSIS — F418 Other specified anxiety disorders: Secondary | ICD-10-CM

## 2021-12-25 NOTE — Progress Notes (Signed)
[place] Counselor/Therapist Progress Note  Patient ID: RICHANDA DARIN, MRN: [id number]    Date: 12/25/21  Time Spent: 4:37 pm - 5:29 pm :  52 Minutes  Treatment Type: Individual Therapy.  Reported Symptoms: increased depressive symptoms, intrusive thoughts (negative self-talk), low motivation, grief.    Mental Status Exam: Appearance:  Neat and Well Groomed     Behavior: Appropriate  Motor: Normal  Speech/Language:  Clear and Coherent  Affect: Depressed  Mood: dysthymic  Thought process: normal  Thought content:   WNL  Sensory/Perceptual disturbances:   WNL  Orientation: oriented to person, place, time/date, and situation  Attention: Good  Concentration: Good  Memory: WNL  Fund of knowledge:  Good  Insight:   Good  Judgment:  Good  Impulse Control: Good   Risk Assessment: Danger to Self:  No Self-injurious Behavior: No Danger to Others: No Duty to Warn:no Physical Aggression / Violence:No  Access to Firearms a concern: No  Gang Involvement:No   Subjective:   Berline Lopes participated from home, via video, and consented to treatment. Therapist participated from office. We met online due to [place] pandemic. Special reviewed the events of the past week. Carle  noted recent travel and noted managing her stress well. She noted being underemployed in [place], where she recently traveled. She noted needing a break to de-stress and noted having to attend  4 days employee retreat. She noted difficulty saying "no" in the professional setting and discussed this being a source of stress. We explored this during the session and the anxiety related to saying "no". We worked on assertiveness during the session including clear and direct communication, which therapist modeled during the session. She was engaged and motivated during the session and expressed commitment towards her goals.  Therapist praised and a follow-up was scheduled.  Interventions: Cognitive  Behavioral Therapy and Interpersonal  Diagnosis:  Other depression  Other specified anxiety disorders  Treatment Plan:  Client Abilities/Strengths Fairy is intelligent, forthcoming, and motivated for change.   Client Treatment Preferences Outpatient Therapy.   Client Statement of Needs Cloris discussed her goals for treatment including continuing journaling, continued consistent physical exercises, boundaries with self for work, actively managing stressors, and managing overall mood.  Treatment Level Weekly  Symptoms Depression: Loss of interest, feeling down, lot motivation, lethargy, mild hypersomnia, feeling bad about self.  (Status: maintained)  Anxiety: Difficulty concentrating, somatic complaints (nausea, loose bowels), anticipatory anxiety, muscle tension, consistent worry ([name]'s health, green-card, bureaucracy), anticipatory anxiety, (Status: maintained)   Goals:  Anayiah experiences symptoms of of depression and anxiety.   Target Date: 02/17/22 Frequency: Weekly  Progress: 5% Modality: individual    Therapist will provide referrals for additional resources as appropriate.  Therapist will provide psycho-education regarding Asharia's diagnosis and corresponding treatment approaches and interventions. Makaylee will employ CBT, BA, Problem-solving, Solution Focused, Mindfulness, and coping skills to help manage decrease symptoms associated with her diagnosis.   Reduce overall level, frequency, and intensity of the feelings of depression and anxiety evidenced by decreased symptomology from 6 to 7 days/week to 0 to 1 days/week per client report for at least 3 consecutive months. Verbally express understanding of the relationship between feelings of depression, anxiety and their impact on thinking patterns and behaviors. Verbalize an understanding of the role that distorted thinking plays in creating fears, excessive worry, and ruminations.  [name] participated in the  creation of the treatment plan)   [name], LCSW

## 2021-12-25 NOTE — Telephone Encounter (Signed)
Requested medication (s) are due for refill today:no  Requested medication (s) are on the active medication list: no  Last refill:  10/20/21, discontinued  Future visit scheduled:yes  Notes to clinic:  Unable to refill per protocol, Rx expired.  Medication was discontinued 10/20/21 by PCP due to dose change.     Requested Prescriptions  Pending Prescriptions Disp Refills   levothyroxine (SYNTHROID) 112 MCG tablet [Pharmacy Med Name: LEVOTHYROXINE 112 MCG TABLET] 90 tablet 0    Sig: TAKE 1 TABLET BY MOUTH EVERY DAY     Endocrinology:  Hypothyroid Agents Passed - 12/24/2021  9:14 AM      Passed - TSH in normal range and within 360 days    TSH  Date Value Ref Range Status  11/27/2021 0.458 0.450 - 4.500 uIU/mL Final         Passed - Valid encounter within last 12 months    Recent Outpatient Visits           2 months ago Encounter for general adult medical examination with abnormal findings   [facility] [place], Coralie Keens, NP   1 year ago Encounter for general adult medical examination with abnormal findings   [facility] [place], Coralie Keens, NP

## 2022-01-07 ENCOUNTER — Other Ambulatory Visit: Payer: Self-pay | Admitting: Internal Medicine

## 2022-01-09 NOTE — Telephone Encounter (Signed)
Requested Prescriptions  Pending Prescriptions Disp Refills  . levothyroxine (SYNTHROID) 125 MCG tablet [Pharmacy Med Name: LEVOTHYROXINE 125 MCG TABLET] 90 tablet 0    Sig: TAKE 1 TABLET BY MOUTH EVERY DAY     Endocrinology:  Hypothyroid Agents Passed - 01/07/2022  3:33 PM      Passed - TSH in normal range and within 360 days    TSH  Date Value Ref Range Status  11/27/2021 0.458 0.450 - 4.500 uIU/mL Final         Passed - Valid encounter within last 12 months    Recent Outpatient Visits          2 months ago Encounter for general adult medical examination with abnormal findings   [facility] [place], Coralie Keens, NP   1 year ago Encounter for general adult medical examination with abnormal findings   [facility] [place], Coralie Keens, NP

## 2022-03-13 ENCOUNTER — Other Ambulatory Visit: Payer: Self-pay | Admitting: Internal Medicine

## 2022-03-13 NOTE — Telephone Encounter (Signed)
Requested Prescriptions  Pending Prescriptions Disp Refills  . escitalopram (LEXAPRO) 20 MG tablet [Pharmacy Med Name: ESCITALOPRAM 20 MG TABLET] 90 tablet 0    Sig: TAKE 1 TABLET BY MOUTH EVERY DAY     Psychiatry:  Antidepressants - SSRI Passed - 03/13/2022  1:16 AM      Passed - Completed PHQ-2 or PHQ-9 in the last 360 days      Passed - Valid encounter within last 6 months    Recent Outpatient Visits          4 months ago Encounter for general adult medical examination with abnormal findings   [facility] [place], Coralie Keens, NP   1 year ago Encounter for general adult medical examination with abnormal findings   [facility] [place], Coralie Keens, NP      Future Appointments            In 1 month [place], Coralie Keens, NP [facility], [facility]

## 2022-04-04 ENCOUNTER — Ambulatory Visit: Payer: BC Managed Care – PPO | Admitting: Psychology

## 2022-04-04 DIAGNOSIS — F3289 Other specified depressive episodes: Secondary | ICD-10-CM

## 2022-04-04 DIAGNOSIS — F418 Other specified anxiety disorders: Secondary | ICD-10-CM

## 2022-04-04 NOTE — Progress Notes (Addendum)
[place] Counselor/Therapist Progress Note  Patient ID: Rachael Holloway, MRN: [id number]    Date: 04/04/22  Time Spent: 11:05 am - 11:51 am :  46 Minutes  Treatment Type: Individual Therapy.  Reported Symptoms: increased depressive symptoms, intrusive thoughts (negative self-talk), low motivation, grief.    Mental Status Exam: Appearance:  Neat and Well Groomed     Behavior: Appropriate  Motor: Normal  Speech/Language:  Clear and Coherent  Affect: Depressed  Mood: dysthymic  Thought process: normal  Thought content:   WNL  Sensory/Perceptual disturbances:   WNL  Orientation: oriented to person, place, time/date, and situation  Attention: Good  Concentration: Good  Memory: WNL  Fund of knowledge:  Good  Insight:   Good  Judgment:  Good  Impulse Control: Good   Risk Assessment: Danger to Self:  No Self-injurious Behavior: No Danger to Others: No Duty to Warn:no Physical Aggression / Violence:No  Access to Firearms a concern: No  Gang Involvement:No   Subjective:   Rachael Holloway participated from home, via video, and consented to treatment. Therapist participated from office. We met online due to [place] pandemic. Amoy reviewed the events of the past week. Rachael Holloway noted being overwhelmed by work and noted this having numerous stressors related to. She noted recently traveling for work and managing her flying anxiety well, overall. She noted feeling exhausted. She noted additional stressors including her cat's recent illness. She noted the recent and unexpected loss of a neighborhood cat and noted the difficulty she experienced as a result. She noted losing track of her self-care as a result. She noted additional stressors including renewing her green card. She noted having a deep fear of bureaucracy and the authority related to. She noted rising anxiety regarding possible unexpected difficulties. We explored this during the session. Therapist praised  Rachael Holloway for her effort to be mindful of her anxiety and her efforts to challenge these thoughts via data, evidence, and experience. She is working on employing her sun lamp and finds this to be effect. She noted both she and Rachael Holloway grieving for their cat, Rachael Holloway, who passed unexpectedly. We continued to process this during the session. She was engaged and motivated during the session and expressed commitment towards her goals. Therapist validated and normalized Rachael Holloway's experience and feelings. Therapist praised and a follow-up was scheduled.  Interventions: Cognitive Behavioral Therapy and Interpersonal  Diagnosis:  Other depression  Other specified anxiety disorders  Treatment Plan:  Client Abilities/Strengths Rachael Holloway is intelligent, forthcoming, and motivated for change.   Client Treatment Preferences Outpatient Therapy.   Client Statement of Needs Rachael Holloway discussed her goals for treatment including continuing journaling, continued consistent physical exercises, boundaries with self for work, actively managing stressors, and managing overall mood.  Treatment Level Weekly  Symptoms Depression: Loss of interest, feeling down, lot motivation, lethargy, mild hypersomnia, feeling bad about self.  (Status: maintained)  Anxiety: Difficulty concentrating, somatic complaints (nausea, loose bowels), anticipatory anxiety, muscle tension, consistent worry (Rachael Holloway's health, green-card, bureaucracy), anticipatory anxiety, (Status: maintained)   Goals:  Rachael Holloway experiences symptoms of of depression and anxiety.   Target Date: 04/19/22 Frequency: Weekly  Progress: 5% Modality: individual    Therapist will provide referrals for additional resources as appropriate.  Therapist will provide psycho-education regarding Rachael Holloway's diagnosis and corresponding treatment approaches and interventions. Rachael Holloway will employ CBT, BA, Problem-solving, Solution Focused, Mindfulness, and coping skills to  help manage decrease symptoms associated with her diagnosis.   Reduce overall level, frequency, and intensity of the feelings  of depression and anxiety evidenced by decreased symptomology from 6 to 7 days/week to 0 to 1 days/week per client report for at least 3 consecutive months. Verbally express understanding of the relationship between feelings of depression, anxiety and their impact on thinking patterns and behaviors. Verbalize an understanding of the role that distorted thinking plays in creating fears, excessive worry, and ruminations.  Rachael Holloway participated in the creation of the treatment plan)   [name], LCSW

## 2022-04-08 ENCOUNTER — Other Ambulatory Visit: Payer: Self-pay | Admitting: Internal Medicine

## 2022-04-09 NOTE — Telephone Encounter (Signed)
Requested Prescriptions  Pending Prescriptions Disp Refills   levothyroxine (SYNTHROID) 125 MCG tablet [Pharmacy Med Name: LEVOTHYROXINE 125 MCG TABLET] 90 tablet 2    Sig: TAKE 1 TABLET BY MOUTH EVERY DAY     Endocrinology:  Hypothyroid Agents Passed - 04/08/2022  8:46 AM      Passed - TSH in normal range and within 360 days    TSH  Date Value Ref Range Status  11/27/2021 0.458 0.450 - 4.500 uIU/mL Final         Passed - Valid encounter within last 12 months    Recent Outpatient Visits           5 months ago Encounter for general adult medical examination with abnormal findings   [facility] [place], Coralie Keens, NP   1 year ago Encounter for general adult medical examination with abnormal findings   [facility], Coralie Keens, NP       Future Appointments             In 3 weeks Garnette Gunner, Coralie Keens, NP [facility], [facility]

## 2022-04-14 ENCOUNTER — Other Ambulatory Visit: Payer: Self-pay | Admitting: Internal Medicine

## 2022-04-17 NOTE — Telephone Encounter (Signed)
Requested Prescriptions  Pending Prescriptions Disp Refills   atorvastatin (LIPITOR) 10 MG tablet [Pharmacy Med Name: ATORVASTATIN 10 MG TABLET] 90 tablet 1    Sig: TAKE 1 TABLET BY MOUTH EVERY DAY     Cardiovascular:  Antilipid - Statins Failed - 04/14/2022  8:58 AM      Failed - Lipid Panel in normal range within the last 12 months    Cholesterol, Total  Date Value Ref Range Status  10/13/2020 216 (H) 100 - 199 mg/dL Final   Cholesterol  Date Value Ref Range Status  10/19/2021 215 (H) <200 mg/dL Final   LDL Cholesterol (Calc)  Date Value Ref Range Status  10/19/2021 117 (H) mg/dL (calc) Final    Comment:    Reference range: <100 . Desirable range <100 mg/dL for primary prevention;   <70 mg/dL for patients with CHD or diabetic patients  with > or = 2 CHD risk factors. Marland Kitchen LDL-C is now calculated using the Martin-Hopkins  calculation, which is a validated novel method providing  better accuracy than the Friedewald equation in the  estimation of LDL-C.  Cresenciano Genre et al. Annamaria Helling. 6378;588(50): 2061-2068  (http://education.[employer].com/faq/FAQ164)    Direct LDL  Date Value Ref Range Status  09/09/2019 150.0 mg/dL Final    Comment:    Optimal:  <100 mg/dLNear or Above Optimal:  100-129 mg/dLBorderline High:  130-159 mg/dLHigh:  160-189 mg/dLVery High:  >190 mg/dL   HDL  Date Value Ref Range Status  10/19/2021 51 > OR = 50 mg/dL Final  10/13/2020 56 >39 mg/dL Final   Triglycerides  Date Value Ref Range Status  10/19/2021 332 (H) <150 mg/dL Final    Comment:    . If a non-fasting specimen was collected, consider repeat triglyceride testing on a fasting specimen if clinically indicated.  Yates Decamp et al. J. of Clin. Lipidol. 2774;1:287-867. Marland Kitchen          Passed - Patient is not pregnant      Passed - Valid encounter within last 12 months    Recent Outpatient Visits           6 months ago Encounter for general adult medical examination with abnormal findings    [facility] [place], Coralie Keens, NP   1 year ago Encounter for general adult medical examination with abnormal findings   [facility], Coralie Keens, NP       Future Appointments             In 2 weeks Garnette Gunner, Coralie Keens, NP [facility], [facility]

## 2022-04-24 ENCOUNTER — Encounter: Payer: Self-pay | Admitting: Internal Medicine

## 2022-04-24 DIAGNOSIS — E039 Hypothyroidism, unspecified: Secondary | ICD-10-CM

## 2022-04-24 DIAGNOSIS — E782 Mixed hyperlipidemia: Secondary | ICD-10-CM

## 2022-04-24 NOTE — Telephone Encounter (Signed)
Please review.  Do you want her to get her labs done before her appointment?  Thanks,   -Mickel Baas

## 2022-04-25 ENCOUNTER — Other Ambulatory Visit: Payer: Self-pay

## 2022-04-25 DIAGNOSIS — E039 Hypothyroidism, unspecified: Secondary | ICD-10-CM

## 2022-04-25 DIAGNOSIS — E782 Mixed hyperlipidemia: Secondary | ICD-10-CM

## 2022-04-26 ENCOUNTER — Other Ambulatory Visit: Payer: BC Managed Care – PPO

## 2022-04-26 ENCOUNTER — Other Ambulatory Visit: Payer: Self-pay

## 2022-04-26 DIAGNOSIS — E782 Mixed hyperlipidemia: Secondary | ICD-10-CM | POA: Diagnosis not present

## 2022-04-26 DIAGNOSIS — E039 Hypothyroidism, unspecified: Secondary | ICD-10-CM

## 2022-04-27 LAB — COMPREHENSIVE METABOLIC PANEL
AG Ratio: 1.7 (calc) (ref 1.0–2.5)
ALT: 6 U/L (ref 6–29)
AST: 13 U/L (ref 10–35)
Albumin: 4 g/dL (ref 3.6–5.1)
Alkaline phosphatase (APISO): 79 U/L (ref 31–125)
BUN: 9 mg/dL (ref 7–25)
CO2: 26 mmol/L (ref 20–32)
Calcium: 8.6 mg/dL (ref 8.6–10.2)
Chloride: 105 mmol/L (ref 98–110)
Creat: 0.84 mg/dL (ref 0.50–0.99)
Globulin: 2.3 g/dL (calc) (ref 1.9–3.7)
Glucose, Bld: 95 mg/dL (ref 65–99)
Potassium: 4.5 mmol/L (ref 3.5–5.3)
Sodium: 138 mmol/L (ref 135–146)
Total Bilirubin: 0.5 mg/dL (ref 0.2–1.2)
Total Protein: 6.3 g/dL (ref 6.1–8.1)

## 2022-04-27 LAB — LIPID PANEL
Cholesterol: 150 mg/dL (ref <200)
HDL: 50 mg/dL (ref >=50)
LDL Cholesterol (Calc): 73 mg/dL (calc)
Non-HDL Cholesterol (Calc): 100 mg/dL (calc) (ref <130)
Total CHOL/HDL Ratio: 3 (calc) (ref <5.0)
Triglycerides: 177 mg/dL — ABNORMAL HIGH (ref <150)

## 2022-04-27 LAB — T4, FREE: Free T4: 1.1 ng/dL (ref 0.8–1.8)

## 2022-04-27 LAB — TSH: TSH: 3.43 mIU/L

## 2022-05-04 ENCOUNTER — Ambulatory Visit: Payer: BC Managed Care – PPO | Admitting: Internal Medicine

## 2022-05-04 ENCOUNTER — Encounter: Payer: Self-pay | Admitting: Internal Medicine

## 2022-05-04 VITALS — BP 128/76 | HR 75 | Temp 97.3°F | Wt 141.0 lb

## 2022-05-04 DIAGNOSIS — E039 Hypothyroidism, unspecified: Secondary | ICD-10-CM

## 2022-05-04 DIAGNOSIS — F419 Anxiety disorder, unspecified: Secondary | ICD-10-CM

## 2022-05-04 DIAGNOSIS — Z6825 Body mass index (BMI) 25.0-25.9, adult: Secondary | ICD-10-CM

## 2022-05-04 DIAGNOSIS — G43019 Migraine without aura, intractable, without status migrainosus: Secondary | ICD-10-CM

## 2022-05-04 DIAGNOSIS — F32A Depression, unspecified: Secondary | ICD-10-CM

## 2022-05-04 DIAGNOSIS — E782 Mixed hyperlipidemia: Secondary | ICD-10-CM | POA: Diagnosis not present

## 2022-05-04 DIAGNOSIS — E663 Overweight: Secondary | ICD-10-CM

## 2022-05-04 NOTE — Progress Notes (Signed)
Subjective:    Patient ID: Rachael Holloway, female    DOB: Nov 27, 1975, 46 y.o.   MRN: [id number]  HPI  Patient presents to clinic today for 50-monthfollow-up of chronic conditions.  Hypothyroidism: She denies any issues on her current dose of Levothyroxine.  She does not follow with endocrinology.  HLD: Her last LDL was, triglycerides.  She denies myalgias on Atorvastatin.  She tries to consume a low-fat diet.  Anxiety and Depression: Chronic, managed on Escitalopram and Bupropion.  She is currently seeing a therapist.  She denies SI/HI.  Migraines: These occur rarely.  Triggered by allergies and hormones.  She takes caffeine, Ibuprofen and cool compresses as needed with good relief of symptoms.  She does not follow with neurology.  Review of Systems     Past Medical History:  Diagnosis Date   Allergy    Chicken pox    Depression    Migraines    Thyroid disease    White coat hypertension     Current Outpatient Medications  Medication Sig Dispense Refill   atorvastatin (LIPITOR) 10 MG tablet TAKE 1 TABLET BY MOUTH EVERY DAY 90 tablet 1   buPROPion (WELLBUTRIN XL) 150 MG 24 hr tablet Take 1 tablet (150 mg total) by mouth daily. 90 tablet 1   Cholecalciferol (VITAMIN D3) 2000 UNITS TABS Take 2 capsules by mouth. 2 times a week     escitalopram (LEXAPRO) 20 MG tablet TAKE 1 TABLET BY MOUTH EVERY DAY 90 tablet 0   Flaxseed, Linseed, (FLAXSEED OIL PO) Take 1 capsule by mouth 2 (two) times daily.     Folate-B12-Intrinsic Factor (INTRINSI B12-FOLATE) 8353-614-43MCG-MCG-MG TABS Take 1 capsule by mouth daily.     ibuprofen (ADVIL,MOTRIN) 400 MG tablet Take 400 mg by mouth as needed.     levothyroxine (SYNTHROID) 125 MCG tablet TAKE 1 TABLET BY MOUTH EVERY DAY 90 tablet 2   loratadine (CLARITIN) 10 MG tablet Take 10 mg by mouth daily.     NIACINAMIDE-ZINC-COPPER-FA PO Take 1 capsule by mouth daily.     No current facility-administered medications for this visit.    Allergies   Allergen Reactions   Dust Mite Mixed Allergen Ext [Mite (D. Farinae)]    Mold Extract [Trichophyton]    Pollen Extract     Family History  Problem Relation Age of Onset   Healthy Mother    Dermatomyositis Mother    Heart disease Father    Hypertension Father    Rheum arthritis Sister    Stroke Maternal Grandmother    Heart disease Maternal Grandfather    Heart disease Paternal Grandmother    Stroke Paternal Grandfather    Breast cancer Cousin    Cancer Neg Hx    Diabetes Neg Hx     Social History   Socioeconomic History   Marital status: Married    Spouse name: Not on file   Number of children: Not on file   Years of education: Not on file   Highest education level: Not on file  Occupational History   Not on file  Tobacco Use   Smoking status: Never   Smokeless tobacco: Never  Vaping Use   Vaping Use: Never used  Substance and Sexual Activity   Alcohol use: Yes    Alcohol/week: 5.0 standard drinks of alcohol    Types: 5 Glasses of wine per week    Comment: moderate   Drug use: No   Sexual activity: Yes  Other Topics Concern  Not on file  Social History Narrative   Not on file   Social Determinants of Health   Financial Resource Strain: Not on file  Food Insecurity: Not on file  Transportation Needs: Not on file  Physical Activity: Not on file  Stress: Not on file  Social Connections: Not on file  Intimate Partner Violence: Not on file     Constitutional: Patient reports headaches.  Denies fever, malaise, fatigue, or abrupt weight changes.  HEENT: Denies eye pain, eye redness, ear pain, ringing in the ears, wax buildup, runny nose, nasal congestion, bloody nose, or sore throat. Respiratory: Denies difficulty breathing, shortness of breath, cough or sputum production.   Cardiovascular: Denies chest pain, chest tightness, palpitations or swelling in the hands or feet.  Gastrointestinal: Denies abdominal pain, bloating, constipation, diarrhea or blood  in the stool.  GU: Patient reports decreased libido, irregular menses.  Denies urgency, frequency, pain with urination, burning sensation, blood in urine, odor or discharge. Musculoskeletal: Denies decrease in range of motion, difficulty with gait, muscle pain or joint pain and swelling.  Skin: Denies redness, rashes, lesions or ulcercations.  Neurological: Denies dizziness, difficulty with memory, difficulty with speech or problems with balance and coordination.  Psych: Patient has a history of anxiety and depression.  Denies  SI/HI.  No other specific complaints in a complete review of systems (except as listed in HPI above).  Objective:   Physical Exam  BP 128/76 (BP Location: Left Arm, Patient Position: Sitting, Cuff Size: Normal)   Pulse 75   Temp (!) 97.3 F (36.3 C) (Temporal)   Wt 141 lb (64 kg)   SpO2 99%   BMI 25.79 kg/m   Wt Readings from Last 3 Encounters:  10/19/21 145 lb (65.8 kg)  10/07/20 140 lb 6.4 oz (63.7 kg)  11/27/19 135 lb (61.2 kg)    General: Appears her stated age, overweight, in NAD. HEENT: Head: normal shape and size; Eyes: sclera white, no icterus, conjunctiva pink, PERRLA and EOMs intact;  Neck:  Neck supple, trachea midline. No masses, lumps or thyromegaly present.  Cardiovascular: Normal rate and rhythm. S1,S2 noted.  No murmur, rubs or gallops noted. No JVD or BLE edema. No carotid bruits noted. Pulmonary/Chest: Normal effort and positive vesicular breath sounds. No respiratory distress. No wheezes, rales or ronchi noted.  Musculoskeletal: No difficulty with gait.  Neurological: Alert and oriented. Psychiatric: Mood and affect normal. Behavior is normal. Judgment and thought content normal.    BMET    Component Value Date/Time   NA 138 04/26/2022 1417   NA 139 10/13/2020 0807   K 4.5 04/26/2022 1417   CL 105 04/26/2022 1417   CO2 26 04/26/2022 1417   GLUCOSE 95 04/26/2022 1417   BUN 9 04/26/2022 1417   BUN 8 10/13/2020 0807   CREATININE  0.84 04/26/2022 1417   CALCIUM 8.6 04/26/2022 1417   GFRNONAA 86 11/06/2018 1433   GFRAA 99 11/06/2018 1433    Lipid Panel     Component Value Date/Time   CHOL 150 04/26/2022 1417   CHOL 216 (H) 10/13/2020 0807   TRIG 177 (H) 04/26/2022 1417   HDL 50 04/26/2022 1417   HDL 56 10/13/2020 0807   CHOLHDL 3.0 04/26/2022 1417   VLDL 34.6 10/08/2019 1023   LDLCALC 73 04/26/2022 1417    CBC    Component Value Date/Time   WBC 7.4 10/19/2021 0846   RBC 4.37 10/19/2021 0846   HGB 14.0 10/19/2021 0846   HGB 14.1 10/13/2020 0807  HCT 41.4 10/19/2021 0846   HCT 42.6 10/13/2020 0807   PLT 258 10/19/2021 0846   PLT 280 10/13/2020 0807   MCV 94.7 10/19/2021 0846   MCV 95 10/13/2020 0807   MCH 32.0 10/19/2021 0846   MCHC 33.8 10/19/2021 0846   RDW 12.1 10/19/2021 0846   RDW 11.8 10/13/2020 0807   LYMPHSABS 2.2 10/13/2020 0807   EOSABS 0.2 10/13/2020 0807   BASOSABS 0.1 10/13/2020 0807    Hgb A1C No results found for: "HGBA1C"         Assessment & Plan:    RTC in 6 months for annual exam Webb Silversmith, NP

## 2022-05-04 NOTE — Patient Instructions (Signed)

## 2022-05-04 NOTE — Assessment & Plan Note (Signed)
TSH and Free t4 reviewed Continue current dose of levothyroxine

## 2022-05-04 NOTE — Assessment & Plan Note (Signed)
Continue caffeine, ibuprofen and cool compresses as needed

## 2022-05-04 NOTE — Assessment & Plan Note (Signed)
Continue escitalopram and buproprion Support offered

## 2022-05-04 NOTE — Assessment & Plan Note (Signed)
Lipid profile reviewed Encouraged her to consume a low fat diet Continue atorvastatin

## 2022-05-04 NOTE — Assessment & Plan Note (Signed)
Encouraged diet and exercise for weight loss ?

## 2022-06-05 ENCOUNTER — Other Ambulatory Visit: Payer: Self-pay | Admitting: Internal Medicine

## 2022-06-05 DIAGNOSIS — E039 Hypothyroidism, unspecified: Secondary | ICD-10-CM

## 2022-06-05 NOTE — Telephone Encounter (Signed)
Unable to refill per protocol, Rx expired. Medications were discontinued 09/18/21, dose change. Will refuse.  Requested Prescriptions  Pending Prescriptions Disp Refills   escitalopram (LEXAPRO) 10 MG tablet [Pharmacy Med Name: ESCITALOPRAM 10 MG TABLET] 90 tablet 0    Sig: TAKE 1 TABLET BY MOUTH EVERY DAY     Psychiatry:  Antidepressants - SSRI Passed - 06/05/2022  8:09 AM      Passed - Completed PHQ-2 or PHQ-9 in the last 360 days      Passed - Valid encounter within last 6 months    Recent Outpatient Visits           1 month ago Intractable migraine without aura and without status migrainosus   [facility] [place], Coralie Keens, NP   7 months ago Encounter for general adult medical examination with abnormal findings   [facility] [place], Coralie Keens, NP   1 year ago Encounter for general adult medical examination with abnormal findings   [facility] [place], Coralie Keens, NP               levothyroxine (SYNTHROID) 112 MCG tablet [Pharmacy Med Name: LEVOTHYROXINE 112 MCG TABLET] 90 tablet 0    Sig: TAKE 1 TABLET BY MOUTH EVERY DAY     Endocrinology:  Hypothyroid Agents Passed - 06/05/2022  8:09 AM      Passed - TSH in normal range and within 360 days    TSH  Date Value Ref Range Status  04/26/2022 3.43 mIU/L Final    Comment:              Reference Range .           > or = 20 Years  0.40-4.50 .                Pregnancy Ranges           First trimester    0.26-2.66           Second trimester   0.55-2.73           Third trimester    0.43-2.91          Passed - Valid encounter within last 12 months    Recent Outpatient Visits           1 month ago Intractable migraine without aura and without status migrainosus   [facility] [place], Coralie Keens, NP   7 months ago Encounter for general adult medical examination with abnormal findings   [place], NP   1 year ago Encounter for general adult  medical examination with abnormal findings   [facility] [place], Coralie Keens, NP

## 2022-06-08 ENCOUNTER — Other Ambulatory Visit: Payer: Self-pay | Admitting: Internal Medicine

## 2022-06-08 NOTE — Telephone Encounter (Signed)
Requested Prescriptions  Pending Prescriptions Disp Refills   escitalopram (LEXAPRO) 20 MG tablet [Pharmacy Med Name: ESCITALOPRAM 20 MG TABLET] 90 tablet 0    Sig: TAKE 1 TABLET BY MOUTH EVERY DAY     Psychiatry:  Antidepressants - SSRI Passed - 06/08/2022  1:18 AM      Passed - Completed PHQ-2 or PHQ-9 in the last 360 days      Passed - Valid encounter within last 6 months    Recent Outpatient Visits           1 month ago Intractable migraine without aura and without status migrainosus   [facility] [place], Coralie Keens, NP   7 months ago Encounter for general adult medical examination with abnormal findings   [place], NP   1 year ago Encounter for general adult medical examination with abnormal findings   [facility] [place], Coralie Keens, NP

## 2022-06-14 ENCOUNTER — Ambulatory Visit: Payer: BC Managed Care – PPO | Admitting: Psychology

## 2022-06-14 DIAGNOSIS — F418 Other specified anxiety disorders: Secondary | ICD-10-CM

## 2022-06-14 DIAGNOSIS — F3289 Other specified depressive episodes: Secondary | ICD-10-CM

## 2022-06-14 NOTE — Progress Notes (Signed)
[facility] Counselor/Therapist Progress Note  Patient ID: Rachael Holloway, MRN: [id number]    Date: 06/14/22  Time Spent: 3:07 pm - 4:00 pm :  53 Minutes  Treatment Type: Individual Therapy.  Reported Symptoms: increased depressive symptoms, intrusive thoughts (negative self-talk), low motivation, grief.    Mental Status Exam: Appearance:  Neat and Well Groomed     Behavior: Appropriate  Motor: Normal  Speech/Language:  Clear and Coherent  Affect: Depressed  Mood: dysthymic  Thought process: normal  Thought content:   WNL  Sensory/Perceptual disturbances:   WNL  Orientation: oriented to person, place, time/date, and situation  Attention: Good  Concentration: Good  Memory: WNL  Fund of knowledge:  Good  Insight:   Good  Judgment:  Good  Impulse Control: Good   Risk Assessment: Danger to Self:  No Self-injurious Behavior: No Danger to Others: No Duty to Warn:no Physical Aggression / Violence:No  Access to Firearms a concern: No  Gang Involvement:No   Subjective:   Rachael Holloway participated from home, via video, and consented to treatment. Therapist participated from home office. We met online due to Rachael Holloway pandemic. Rachael Holloway reviewed the events of the past week.She noted feeling flat after a difficult and arduous work project. She noted overextending during this time. She noted not having enough time to "recharge" and noted feeling overstimulated. She noted her worry about being required to engage in a similar project in Jan. 2025. She noted worry regarding husband's health due to a recent visit to the ER. She noted the need to have her mother being placed in a care home due to her decline in health and general functioning while cognitively functioning quite well.  Therapist introduced wise-mind to aid in integration of reasonable and emotional mind. Therapist provided psycho-education and emailed Rachael Holloway an email with resources for reference and review. We  worked on applying this tool to her current experience. Therapist validated Rachael Holloway's feelings and experiences during the session. We scheduled a follow-up for continued treatment.   Interventions: Cognitive Behavioral Therapy and Interpersonal  Diagnosis:  Other depression  Other specified anxiety disorders  Treatment Plan:  Client Abilities/Strengths Rachael Holloway is intelligent, forthcoming, and motivated for change.   Client Treatment Preferences Outpatient Therapy.   Client Statement of Needs Rachael Holloway discussed her goals for treatment including continuing journaling, continued consistent physical exercises, boundaries with self for work, actively managing stressors, and managing overall mood.  Treatment Level Weekly  Symptoms Depression: Loss of interest, feeling down, lot motivation, lethargy, mild hypersomnia, feeling bad about self.  (Status: maintained)  Anxiety: Difficulty concentrating, somatic complaints (nausea, loose bowels), anticipatory anxiety, muscle tension, consistent worry (Rachael Holloway's health, green-card, bureaucracy), anticipatory anxiety, (Status: maintained)   Goals:  Rachael Holloway experiences symptoms of of depression and anxiety.   Target Date: 08/18/22 Frequency: Weekly  Progress: 5% Modality: individual    Therapist will provide referrals for additional resources as appropriate.  Therapist will provide psycho-education regarding Rachael Holloway's diagnosis and corresponding treatment approaches and interventions. Rachael Holloway will employ CBT, BA, Problem-solving, Solution Focused, Mindfulness, and coping skills to help manage decrease symptoms associated with her diagnosis.   Reduce overall level, frequency, and intensity of the feelings of depression and anxiety evidenced by decreased symptomology from 6 to 7 days/week to 0 to 1 days/week per client report for at least 3 consecutive months. Verbally express understanding of the relationship between feelings of depression, anxiety  and their impact on thinking patterns and behaviors. Verbalize an understanding of the role that distorted thinking  plays in creating fears, excessive worry, and ruminations.  Rachael Holloway participated in the creation of the treatment plan)   Rachael Irish, LCSW

## 2022-06-15 ENCOUNTER — Other Ambulatory Visit: Payer: Self-pay | Admitting: Internal Medicine

## 2022-06-15 DIAGNOSIS — Z1231 Encounter for screening mammogram for malignant neoplasm of breast: Secondary | ICD-10-CM

## 2022-07-06 ENCOUNTER — Ambulatory Visit
Admission: RE | Admit: 2022-07-06 | Discharge: 2022-07-06 | Disposition: A | Discharge status: Home / Self Care | Payer: BC Managed Care – PPO | Source: Ambulatory Visit | Attending: Internal Medicine | Admitting: Internal Medicine

## 2022-07-06 DIAGNOSIS — Z1231 Encounter for screening mammogram for malignant neoplasm of breast: Secondary | ICD-10-CM

## 2022-07-16 ENCOUNTER — Encounter: Payer: Self-pay | Admitting: Internal Medicine

## 2022-07-16 MED ORDER — FLUTICASONE PROPIONATE 50 MCG/ACT NA SUSP: 2.0000 | Freq: Every day | NASAL | 6 refills | Status: DC

## 2022-08-13 ENCOUNTER — Ambulatory Visit: Payer: BC Managed Care – PPO | Admitting: Psychology

## 2022-08-13 DIAGNOSIS — F3289 Other specified depressive episodes: Secondary | ICD-10-CM

## 2022-08-13 DIAGNOSIS — F418 Other specified anxiety disorders: Secondary | ICD-10-CM

## 2022-08-13 NOTE — Progress Notes (Signed)
Comprehensive Clinical Assessment (CCA) Note  08/13/2022 Rachael Holloway:[id number]  Time Spent: 3:10  pm - 3:59 pm:  4 Minutes  Chief Complaint: No chief complaint on file.  Visit Diagnosis: f32.89, f41.8.    Guardian/Payee:  self    Paperwork requested: No   Reason for Visit /Presenting Problem: depression and anxiety.   Mental Status Exam: Appearance:   Casual     Behavior:  Appropriate  Motor:  Normal  Speech/Language:   Clear and Coherent  Affect:  Flat  Mood:  dysthymic  Thought process:  normal  Thought content:    WNL  Sensory/Perceptual disturbances:    WNL  Orientation:  oriented to person, place, time/date, and situation  Attention:  Good  Concentration:  Good  Memory:  WNL  Fund of knowledge:   Good  Insight:    Good  Judgment:   Good  Impulse Control:  Good   Reported Symptoms:  depression and anxiety.   Risk Assessment: Danger to Self:  No Self-injurious Behavior: No Danger to Others: No Duty to Warn:no Physical Aggression / Violence:No  Access to Firearms a concern: No  Gang Involvement:No  Patient / guardian was educated about steps to take if suicide or homicide risk level increases between visits: no While future psychiatric events cannot be accurately predicted, the patient does not currently require acute inpatient psychiatric care and does not currently meet [facility] involuntary commitment criteria.  Substance Abuse History: Current substance abuse: No     Caffeine: 3x black tea daily.  Tobacco: denied.  Alcohol: 2x per week after dinner.  Substance use:  denied.   Past Psychiatric History:   Previous psychological history is significant for anxiety and depression Outpatient Providers: [name], [facility].  History of Psych Hospitalization: No  Psychological Testing:  NA    Abuse History:  Victim of: No.,  na    Report needed: No. Victim of Neglect:No. Perpetrator of  na   Witness / Exposure  to Domestic Violence: No   Protective Services Involvement: No  Witness to [employer] Violence:  No   Family History:  Family History  Problem Relation Age of Onset   Healthy Mother    Dermatomyositis Mother    Heart disease Father    Hypertension Father    Rheum arthritis Sister    Stroke Maternal Grandmother    Heart disease Maternal Grandfather    Heart disease Paternal Grandmother    Stroke Paternal Grandfather    Breast cancer Cousin    Cancer Neg Hx    Diabetes Neg Hx     Living situation: the patient lives with their spouse  Sexual Orientation: Straight  Relationship Status: married  Name of spouse / other: Nicole Kindred (13 years).  If a parent, number of children / ages:0  Support Systems: spouse  Financial Stress:  No   Income/Employment/Disability: Employment: Sports coach.   Military Service: No   Educational History: Education:  Phd  Religion/Sprituality/World View: none  Any cultural differences that may affect / interfere with treatment:  not applicable   Recreation/Hobbies: reading, cooking, hiking, crafting, baking.   Stressors: Other: external stressors (work, aging mother, work/home life balance.      Strengths: Supportive Relationships, Family, Hopefulness, Self Advocate, Able to Communicate Effectively, and Other  Barriers:  Mood and day-to-day stressors.    Legal History: Pending legal issue / charges: The patient has no significant history of legal issues. History of legal issue / charges:  NA  Medical History/Surgical History: reviewed Past Medical History:  Diagnosis Date   Allergy    Chicken pox    Depression    Migraines    Thyroid disease    White coat hypertension     Past Surgical History:  Procedure Laterality Date   COLONOSCOPY WITH PROPOFOL N/A 11/27/2019   Procedure: COLONOSCOPY WITH PROPOFOL;  Surgeon: Jonathon Bellows, MD;  Location: [facility] ENDOSCOPY;  Service: Gastroenterology;  Laterality: N/A;   EAR TUBE REMOVAL  1985    WISDOM TOOTH EXTRACTION      Medications: Current Outpatient Medications  Medication Sig Dispense Refill   fluticasone (FLONASE) 50 MCG/ACT nasal spray Place 2 sprays into both nostrils daily. 16 g 6   atorvastatin (LIPITOR) 10 MG tablet TAKE 1 TABLET BY MOUTH EVERY DAY 90 tablet 1   buPROPion (WELLBUTRIN XL) 150 MG 24 hr tablet Take 1 tablet (150 mg total) by mouth daily. 90 tablet 1   Cholecalciferol (VITAMIN D3) 2000 UNITS TABS Take 2 capsules by mouth. 2 times a week     escitalopram (LEXAPRO) 20 MG tablet TAKE 1 TABLET BY MOUTH EVERY DAY 90 tablet 0   Flaxseed, Linseed, (FLAXSEED OIL PO) Take 1 capsule by mouth 2 (two) times daily.     Folate-B12-Intrinsic Factor (INTRINSI B12-FOLATE) [id number] MCG-MCG-MG TABS Take 1 capsule by mouth daily.     ibuprofen (ADVIL,MOTRIN) 400 MG tablet Take 400 mg by mouth as needed.     levothyroxine (SYNTHROID) 125 MCG tablet TAKE 1 TABLET BY MOUTH EVERY DAY 90 tablet 2   loratadine (CLARITIN) 10 MG tablet Take 10 mg by mouth daily.     NIACINAMIDE-ZINC-COPPER-FA PO Take 1 capsule by mouth daily.     No current facility-administered medications for this visit.    Allergies  Allergen Reactions   Dust Mite Mixed Allergen Ext [Mite (D. Farinae)]    Mold Extract [Trichophyton]    Pollen Extract     Diagnoses:  Other depression  Other specified anxiety disorders  Psychiatric Treatment: Yes , via PCP. Please see chart.   Plan of Care: Outpatient therapy.   Narrative:   Rachael Holloway participated from home, via video, and consented to treatment. Therapist participated from home office. We met online due to [place] pandemic. Rachael Holloway was initially was referred for counseling by her PCP for anxiety. This is her annual reassessment. We discussed the limits of confidentiality prior to the start of the evaluation. She noted her primary difficulty, at this time, is depressive symptoms. She noted her current stressors including work stressors,  husband's unemployment, recent loss of father, transitioning mother to retirement home, recent loss of a International aid/development worker, and a lack of consistency in regards self-care. 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 Additionally, she is working on projects and papers during her spare time. She noted the start and end of each semester being stressful. She noted her husband being recently laid off and not being able to find a specific job that he is searching for. She denied any current financial stressors. She noted the stressor in relation to her mother's transition to to a home and this being difficult due to distance. She noted her mother's reluctance towards the transitions. She noted numerous losses including the loss of her father and the loss of a pet cat. She noted the upcoming marriage anniversary for her parents. She noted a need for more consistent self-care but has made improvement in regards to work/home-life balance. She  noted work related personality stressors. She noted some relationship stressors due to a loss of sex drive on her behalf. Therapist encouraged Rachael Holloway to contact her OBGYN and Rachael Holloway was agreeable to this. Her currently medications are prescribed by PCP. Please see chart for details. Rachael Holloway has had an annual physical in the past 12 months. We scheduled a follow-up for continued treatment, which she would benefit from. Rachael Holloway is consistent and engaged in treatment. She is intelligent, self-aware, and motivated for change.      08/13/2022    3:20 PM 05/04/2022   11:12 AM 10/19/2021    8:23 AM 10/07/2020    3:07 PM 09/09/2019    3:19 PM  Depression screen PHQ 2/9  Decreased Interest 0 0 2 0 0  Down, Depressed, Hopeless 1 1 3 1  0  PHQ - 2 Score 1 1 5 1  0  Altered sleeping 2  2 1  0  Tired, decreased energy 2  2 1  0  Change in appetite 0  1 0 0  Feeling bad or failure about yourself  1  3 0 0  Trouble concentrating 0  1 0 0  Moving slowly or fidgety/restless 0  0  0 0  Suicidal thoughts 0  0 0 0  PHQ-9 Score 6  14 3  0  Difficult doing work/chores   Not difficult at all Not difficult at all Not difficult at all      08/13/2022    3:17 PM 05/04/2022   11:12 AM 10/19/2021    8:23 AM 10/07/2020    3:07 PM  GAD 7 : Generalized Anxiety Score  Nervous, Anxious, on Edge 1 1 1 1   Control/stop worrying 0 0 0 0  Worry too much - different things 1 0 1 0  Trouble relaxing 1 0 1 0  Restless 0 0 0 0  Easily annoyed or irritable 0 1 0 1  Afraid - awful might happen 0 0 1 1  Total GAD 7 Score 3 2 4 3   Anxiety Difficulty Somewhat difficult Somewhat difficult  Not difficult at all   [facility], LCSW

## 2022-08-15 ENCOUNTER — Encounter: Payer: Self-pay | Admitting: Internal Medicine

## 2022-08-30 ENCOUNTER — Other Ambulatory Visit: Payer: Self-pay | Admitting: Internal Medicine

## 2022-08-30 NOTE — Telephone Encounter (Signed)
Requested Prescriptions  Pending Prescriptions Disp Refills   escitalopram (LEXAPRO) 20 MG tablet [Pharmacy Med Name: ESCITALOPRAM 20 MG TABLET] 90 tablet 0    Sig: TAKE 1 TABLET BY MOUTH EVERY DAY     Psychiatry:  Antidepressants - SSRI Passed - 08/30/2022  1:31 AM      Passed - Completed PHQ-2 or PHQ-9 in the last 360 days      Passed - Valid encounter within last 6 months    Recent Outpatient Visits           3 months ago Intractable migraine without aura and without status migrainosus   [facility] [place], Salvadore Oxford, NP   10 months ago Encounter for general adult medical examination with abnormal findings   [address] [facility] [place], Salvadore Oxford, NP   1 year ago Encounter for general adult medical examination with abnormal findings   [facility] [place], Salvadore Oxford, NP

## 2022-09-03 ENCOUNTER — Other Ambulatory Visit: Payer: Self-pay | Admitting: Internal Medicine

## 2022-09-04 NOTE — Telephone Encounter (Signed)
Patient will need an office visit for further refills. Requested Prescriptions  Pending Prescriptions Disp Refills   buPROPion (WELLBUTRIN XL) 150 MG 24 hr tablet [Pharmacy Med Name: BUPROPION HCL XL 150 MG TABLET] 90 tablet 0    Sig: TAKE 1 TABLET BY MOUTH EVERY DAY     Psychiatry: Antidepressants - bupropion Passed - 09/03/2022  2:11 AM      Passed - Cr in normal range and within 360 days    Creat  Date Value Ref Range Status  04/26/2022 0.84 0.50 - 0.99 mg/dL Final         Passed - AST in normal range and within 360 days    AST  Date Value Ref Range Status  04/26/2022 13 10 - 35 U/L Final         Passed - ALT in normal range and within 360 days    ALT  Date Value Ref Range Status  04/26/2022 6 6 - 29 U/L Final         Passed - Completed PHQ-2 or PHQ-9 in the last 360 days      Passed - Last BP in normal range    BP Readings from Last 1 Encounters:  05/04/22 128/76         Passed - Valid encounter within last 6 months    Recent Outpatient Visits           4 months ago Intractable migraine without aura and without status migrainosus   [facility] [place], Salvadore Oxford, NP   10 months ago Encounter for general adult medical examination with abnormal findings   [facility] [place], Salvadore Oxford, NP   1 year ago Encounter for general adult medical examination with abnormal findings   [facility] [place], Salvadore Oxford, NP

## 2022-09-10 ENCOUNTER — Telehealth: Payer: Self-pay | Admitting: Gastroenterology

## 2022-09-10 DIAGNOSIS — Z86010 Personal history of colonic polyps: Secondary | ICD-10-CM

## 2022-09-10 NOTE — Telephone Encounter (Signed)
Pt left message to schedule colonoscopy please retuurn call

## 2022-09-11 ENCOUNTER — Other Ambulatory Visit: Payer: Self-pay

## 2022-09-11 DIAGNOSIS — Z86010 Personal history of colonic polyps: Secondary | ICD-10-CM

## 2022-09-11 MED ORDER — SUFLAVE 178.7 G PO SOLR: 178.7000 g | Freq: Two times a day (BID) | ORAL | 0 refills | Status: AC

## 2022-09-11 NOTE — Telephone Encounter (Signed)
Gastroenterology Pre-Procedure Review  Request Date: 11/30/22 Requesting Physician: Dr. Tobi Bastos  PATIENT REVIEW QUESTIONS: The patient responded to the following health history questions as indicated:   Used Suflave prep due to nausea associated with Suprep previosly.  1. Are you having any GI issues? no 2. Do you have a personal history of Polyps? yes (last colonoscopy performed by Dr. Tobi Bastos 11/27/19) 3. Do you have a family history of Colon Cancer or Polyps? no 4. Diabetes Mellitus? no 5. Joint replacements in the past 12 months?no 6. Major health problems in the past 3 months?no 7. Any artificial heart valves, MVP, or defibrillator?no    MEDICATIONS & ALLERGIES:    Patient reports the following regarding taking any anticoagulation/antiplatelet therapy:   Plavix, Coumadin, Eliquis, Xarelto, Lovenox, Pradaxa, Brilinta, or Effient? no Aspirin? no  Patient confirms/reports the following medications:  Current Outpatient Medications  Medication Sig Dispense Refill   fluticasone (FLONASE) 50 MCG/ACT nasal spray Place 2 sprays into both nostrils daily. 16 g 6   atorvastatin (LIPITOR) 10 MG tablet TAKE 1 TABLET BY MOUTH EVERY DAY 90 tablet 1   buPROPion (WELLBUTRIN XL) 150 MG 24 hr tablet TAKE 1 TABLET BY MOUTH EVERY DAY 90 tablet 0   Cholecalciferol (VITAMIN D3) 2000 UNITS TABS Take 2 capsules by mouth. 2 times a week     escitalopram (LEXAPRO) 20 MG tablet TAKE 1 TABLET BY MOUTH EVERY DAY 90 tablet 0   Flaxseed, Linseed, (FLAXSEED OIL PO) Take 1 capsule by mouth 2 (two) times daily.     Folate-B12-Intrinsic Factor (INTRINSI B12-FOLATE) 800-500-20 MCG-MCG-MG TABS Take 1 capsule by mouth daily.     ibuprofen (ADVIL,MOTRIN) 400 MG tablet Take 400 mg by mouth as needed.     levothyroxine (SYNTHROID) 125 MCG tablet TAKE 1 TABLET BY MOUTH EVERY DAY 90 tablet 2   loratadine (CLARITIN) 10 MG tablet Take 10 mg by mouth daily.     NIACINAMIDE-ZINC-COPPER-FA PO Take 1 capsule by mouth daily.     No  current facility-administered medications for this visit.    Patient confirms/reports the following allergies:  Allergies  Allergen Reactions   Dust Mite Mixed Allergen Ext [Mite (D. Farinae)]    Mold Extract [Trichophyton]    Pollen Extract     No orders of the defined types were placed in this encounter.   AUTHORIZATION INFORMATION Primary Insurance: 1D#: Group #:  Secondary Insurance: 1D#: Group #:  SCHEDULE INFORMATION: Date: 11/30/22 Time: Location: [facility]

## 2022-09-11 NOTE — Addendum Note (Signed)
Addended by: Avie Arenas on: 09/11/2022 10:13 AM   Modules accepted: Orders

## 2022-09-12 MED ORDER — NA SULFATE-K SULFATE-MG SULF 17.5-3.13-1.6 GM/177ML PO SOLN: 354.0000 mL | Freq: Once | ORAL | 0 refills | Status: AC

## 2022-09-12 NOTE — Addendum Note (Signed)
Addended by: Adela Ports on: 09/12/2022 04:36 PM   Modules accepted: Orders

## 2022-09-26 ENCOUNTER — Ambulatory Visit: Payer: BC Managed Care – PPO | Admitting: Psychology

## 2022-10-11 ENCOUNTER — Other Ambulatory Visit: Payer: Self-pay | Admitting: Internal Medicine

## 2022-10-11 NOTE — Telephone Encounter (Signed)
Requested Prescriptions  Pending Prescriptions Disp Refills   atorvastatin (LIPITOR) 10 MG tablet [Pharmacy Med Name: ATORVASTATIN 10 MG TABLET] 90 tablet 1    Sig: TAKE 1 TABLET BY MOUTH EVERY DAY     Cardiovascular:  Antilipid - Statins Failed - 10/11/2022  2:08 AM      Failed - Lipid Panel in normal range within the last 12 months    Cholesterol, Total  Date Value Ref Range Status  10/13/2020 216 (H) 100 - 199 mg/dL Final   Cholesterol  Date Value Ref Range Status  04/26/2022 150 <200 mg/dL Final   LDL Cholesterol (Calc)  Date Value Ref Range Status  04/26/2022 73 mg/dL (calc) Final    Comment:    Reference range: <100 . Desirable range <100 mg/dL for primary prevention;   <70 mg/dL for patients with CHD or diabetic patients  with > or = 2 CHD risk factors. Marland Kitchen LDL-C is now calculated using the Martin-Hopkins  calculation, which is a validated novel method providing  better accuracy than the Friedewald equation in the  estimation of LDL-C.  Horald Pollen et al. Lenox Ahr. 1610;960(45): 2061-2068  (http://education.[employer].com/faq/FAQ164)    Direct LDL  Date Value Ref Range Status  09/09/2019 150.0 mg/dL Final    Comment:    Optimal:  <100 mg/dLNear or Above Optimal:  100-129 mg/dLBorderline High:  130-159 mg/dLHigh:  160-189 mg/dLVery High:  >190 mg/dL   HDL  Date Value Ref Range Status  04/26/2022 50 > OR = 50 mg/dL Final  40/98/1191 56 >47 mg/dL Final   Triglycerides  Date Value Ref Range Status  04/26/2022 177 (H) <150 mg/dL Final         Passed - Patient is not pregnant      Passed - Valid encounter within last 12 months    Recent Outpatient Visits           5 months ago Intractable migraine without aura and without status migrainosus   [facility] [place], Salvadore Oxford, NP   11 months ago Encounter for general adult medical examination with abnormal findings   [facility] [place], Salvadore Oxford, NP   2 years  ago Encounter for general adult medical examination with abnormal findings   [facility] [place], Salvadore Oxford, NP

## 2022-10-18 ENCOUNTER — Ambulatory Visit: Payer: BC Managed Care – PPO | Admitting: Psychology

## 2022-10-19 ENCOUNTER — Ambulatory Visit: Payer: BC Managed Care – PPO | Admitting: Psychology

## 2022-10-19 DIAGNOSIS — F418 Other specified anxiety disorders: Secondary | ICD-10-CM | POA: Diagnosis not present

## 2022-10-19 DIAGNOSIS — F3289 Other specified depressive episodes: Secondary | ICD-10-CM

## 2022-10-19 NOTE — Progress Notes (Signed)
[facility] Counselor/Therapist Progress Note  Patient ID: RAKIA LEHEW, MRN: [id number]   Date: 10/19/22  Time Spent: 11:01  am - 11:52 am : 51 Minutes  Treatment Type: Individual Therapy.  Reported Symptoms: Depression and anxiety.  Mental Status Exam: Appearance:  Neat and Well Groomed     Behavior: Appropriate  Motor: Normal  Speech/Language:  Clear and Coherent  Affect: Appropriate  Mood: anxious and dysthymic  Thought process: normal  Thought content:   WNL  Sensory/Perceptual disturbances:   WNL  Orientation: oriented to person, place, time/date, and situation  Attention: Good  Concentration: Good  Memory: WNL  Fund of knowledge:  Good  Insight:   Good  Judgment:  Good  Impulse Control: Good   Risk Assessment: Danger to Self:  No Self-injurious Behavior: No Danger to Others: No Duty to Warn:no Physical Aggression / Violence:No  Access to Firearms a concern: No  Gang Involvement:No   Subjective:   Sherre Scarlet participated from home, via video and consented to treatment. Therapist participated from home office. We met online due to COVID pandemic. Mareah reviewed the events of the past week. We reviewed numerous treatment approaches including CBT, BA, Problem Solving, and Solution focused therapy. Psych-education regarding the Mayumi's diagnosis of Other depression  Other specified anxiety disorders was provided during the session. We discussed CAYLIE POAGE goals treatment goals which include manage symptoms, be more mindful, engage in relaxation, manage self-talk, challenge negative thoughts and feelings, and engage in self-care. Suzette Battiest Groman provided verbal approval of the treatment plan.   Interventions: Psycho-education & Goal Setting.   Diagnosis:  Other depression  Other specified anxiety disorders  Psychiatric Treatment: Yes , Via PCP.    Treatment Plan:  Client Abilities/Strengths Mikaiah is intelligent,  well education, and motivated for change.   Support System: Family  Client Treatment Preferences Outpatient  Client Statement of Needs Emmilee would like to manage symptoms, be more mindful, engage in relaxation, manage self-talk, challenge negative thoughts and feelings, and engage in self-care.   Treatment Level Weekly  Symptoms  Depression: feeling down, poor sleep, lethargy, feeling bad about self.   (Status: maintained) Anxiety: Feeling anxious, worrying about different things, trouble relaxing.    (Status: maintained)  Goals:   Havilah experiences symptoms of depression and anxiety.  Treatment plan signed and available on s-drive:  No, pending signature.    Target Date: 10/19/23 Frequency: Weekly  Progress: 0 Modality: individual    Therapist will provide referrals for additional resources as appropriate.  Therapist will provide psycho-education regarding Janelly's diagnosis and corresponding treatment approaches and interventions. Licensed Clinical Social Worker, Bassett, LCSW will support the patient's ability to achieve the goals identified. will employ CBT, BA, Problem-solving, Solution Focused, Mindfulness,  coping skills, & other evidenced-based practices will be used to promote progress towards healthy functioning to help manage decrease symptoms associated with her diagnosis.   Reduce overall level, frequency, and intensity of the feelings of depression, anxiety and panic evidenced by decreased overall symptoms from 6 to 7 days/week to 0 to 1 days/week per client report for at least 3 consecutive months. Verbally express understanding of the relationship between feelings of depression, anxiety and their impact on thinking patterns and behaviors. Verbalize an understanding of the role that distorted thinking plays in creating fears, excessive worry, and ruminations.    Lynnae Sandhoff participated in the creation of the treatment plan)    [name], LCSW

## 2022-11-29 ENCOUNTER — Encounter: Payer: Self-pay | Admitting: Gastroenterology

## 2022-11-30 ENCOUNTER — Encounter: Payer: Self-pay | Admitting: Gastroenterology

## 2022-11-30 ENCOUNTER — Ambulatory Visit: Payer: BC Managed Care – PPO | Admitting: Anesthesiology

## 2022-11-30 ENCOUNTER — Encounter
Admission: RE | Disposition: A | Discharge status: Home / Self Care | Payer: Self-pay | Source: Ambulatory Visit | Attending: Gastroenterology

## 2022-11-30 ENCOUNTER — Other Ambulatory Visit: Payer: Self-pay | Admitting: Internal Medicine

## 2022-11-30 ENCOUNTER — Ambulatory Visit
Admission: RE | Admit: 2022-11-30 | Discharge: 2022-11-30 | Disposition: A | Discharge status: Home / Self Care | Payer: BC Managed Care – PPO | Source: Ambulatory Visit | Attending: Gastroenterology | Admitting: Gastroenterology

## 2022-11-30 DIAGNOSIS — I10 Essential (primary) hypertension: Secondary | ICD-10-CM | POA: Insufficient documentation

## 2022-11-30 DIAGNOSIS — Z09 Encounter for follow-up examination after completed treatment for conditions other than malignant neoplasm: Secondary | ICD-10-CM | POA: Diagnosis not present

## 2022-11-30 DIAGNOSIS — Z1211 Encounter for screening for malignant neoplasm of colon: Secondary | ICD-10-CM | POA: Diagnosis not present

## 2022-11-30 DIAGNOSIS — E039 Hypothyroidism, unspecified: Secondary | ICD-10-CM | POA: Insufficient documentation

## 2022-11-30 DIAGNOSIS — F32A Depression, unspecified: Secondary | ICD-10-CM | POA: Diagnosis not present

## 2022-11-30 DIAGNOSIS — Z86010 Personal history of colonic polyps: Secondary | ICD-10-CM | POA: Diagnosis not present

## 2022-11-30 HISTORY — PX: COLONOSCOPY WITH PROPOFOL: SHX5780

## 2022-11-30 LAB — POCT PREGNANCY, URINE: Preg Test, Ur: NEGATIVE

## 2022-11-30 SURGERY — COLONOSCOPY WITH PROPOFOL
Anesthesia: General

## 2022-11-30 MED ORDER — SODIUM CHLORIDE 0.9 % IV SOLN: INTRAVENOUS | Status: DC

## 2022-11-30 MED ORDER — PROPOFOL 500 MG/50ML IV EMUL
INTRAVENOUS | Status: DC | PRN
  Administered 2022-11-30: 100 mg via INTRAVENOUS
  Administered 2022-11-30: 150 ug/kg/min via INTRAVENOUS
  Administered 2022-11-30: 30 mg via INTRAVENOUS

## 2022-11-30 MED ORDER — LIDOCAINE HCL (CARDIAC) PF 100 MG/5ML IV SOSY
PREFILLED_SYRINGE | INTRAVENOUS | Status: DC | PRN
  Administered 2022-11-30: 50 mg via INTRAVENOUS

## 2022-11-30 MED ORDER — PROPOFOL 1000 MG/100ML IV EMUL
INTRAVENOUS | Status: AC
  Filled 2022-11-30: qty 100

## 2022-11-30 MED ORDER — EPHEDRINE 5 MG/ML INJ
INTRAVENOUS | Status: AC
  Filled 2022-11-30: qty 5

## 2022-11-30 MED ORDER — PHENYLEPHRINE 80 MCG/ML (10ML) SYRINGE FOR IV PUSH (FOR BLOOD PRESSURE SUPPORT)
PREFILLED_SYRINGE | INTRAVENOUS | Status: AC
  Filled 2022-11-30: qty 10

## 2022-11-30 NOTE — Anesthesia Preprocedure Evaluation (Signed)
Anesthesia Evaluation  Patient identified by MRN, date of birth, ID band Patient awake    Reviewed: Allergy & Precautions, NPO status , Patient's Chart, lab work & pertinent test results  Airway Mallampati: II  TM Distance: >3 FB Neck ROM: Full    Dental  (+) Teeth Intact   Pulmonary neg pulmonary ROS   Pulmonary exam normal breath sounds clear to auscultation       Cardiovascular Exercise Tolerance: Good hypertension, negative cardio ROS Normal cardiovascular exam Rhythm:Regular Rate:Normal     Neuro/Psych  Headaches  Anxiety     negative neurological ROS  negative psych ROS   GI/Hepatic negative GI ROS, Neg liver ROS,,,  Endo/Other  negative endocrine ROSHypothyroidism    Renal/GU negative Renal ROS  negative genitourinary   Musculoskeletal   Abdominal Normal abdominal exam  (+)   Peds negative pediatric ROS (+)  Hematology negative hematology ROS (+)   Anesthesia Other Findings Past Medical History: No date: Allergy No date: Chicken pox No date: Depression No date: Migraines No date: Thyroid disease No date: White coat hypertension  Past Surgical History: 11/27/2019: COLONOSCOPY WITH PROPOFOL; N/A     Comment:  Procedure: COLONOSCOPY WITH PROPOFOL;  Surgeon: [name], MD;  [facility] END[facility]COPY;  Service:               Gastroenterology;  Laterality: N/A; 1985: EAR TUBE REMOVAL No date: WISDOM TOOTH EXTRACTION     Reproductive/Obstetrics negative OB ROS                             Anesthesia Physical Anesthesia Plan  ASA: 2  Anesthesia Plan: General   Post-op Pain Management:    Induction: Intravenous  PONV Risk Score and Plan: Propofol infusion and TIVA  Airway Management Planned: Natural Airway  Additional Equipment:   Intra-op Plan:   Post-operative Plan:   Informed Consent: I have reviewed the patients History and Physical, chart, labs  and discussed the procedure including the risks, benefits and alternatives for the proposed anesthesia with the patient or authorized representative who has indicated his/her understanding and acceptance.     Dental Advisory Given  Plan Discussed with: CRNA and Surgeon  Anesthesia Plan Comments:        Anesthesia Quick Evaluation

## 2022-11-30 NOTE — Op Note (Signed)
[facility] Gastroenterology Patient Name: Rachael Holloway Procedure Date: 11/30/2022 7:28 AM MRN: [id number] Account #: [id number] Date of Birth: 1976/03/09 Admit Type: Outpatient Age: 47 Room: [facility] ENDO ROOM 4 Gender: Female Note Status: Finalized Instrument Name: Nelda Marseille [id number] Procedure:             Colonoscopy Indications:           Surveillance: Personal history of adenomatous polyps                         on last colonoscopy 3 years ago Providers:             [name] MD, MD Medicines:             Monitored Anesthesia Care Complications:         No immediate complications. Procedure:             Pre-Anesthesia Assessment:                        - Prior to the procedure, a History and Physical was                         performed, and patient medications, allergies and                         sensitivities were reviewed. The patient's tolerance                         of previous anesthesia was reviewed.                        - ASA Grade Assessment: II - A patient with mild                         systemic disease.                        After obtaining informed consent, the colonoscope was                         passed under direct vision. Throughout the procedure,                         the patient's blood pressure, pulse, and oxygen                         saturations were monitored continuously. The                         Colonoscope was introduced through the anus and                         advanced to the the cecum, identified by the                         appendiceal orifice. The colonoscopy was performed                         with ease. The patient tolerated the procedure well.  The quality of the bowel preparation was excellent.                         The ileocecal valve, appendiceal orifice, and rectum                         were photographed. Findings:      The perianal and digital rectal examinations  were normal.      The entire examined colon appeared normal on direct and retroflexion       views. Impression:            - The entire examined colon is normal on direct and                         retroflexion views.                        - No specimens collected. Recommendation:        - Discharge patient to home (with escort).                        - Resume previous diet.                        - Continue present medications.                        - Repeat colonoscopy in 5 years for surveillance. Procedure Code(s):     --- Professional ---                        [phone], Colonoscopy, flexible; diagnostic, including                         collection of specimen(s) by brushing or washing, when                         performed (separate procedure) Diagnosis Code(s):     --- Professional ---                        Z86.010, Personal history of colonic polyps CPT copyright 2022 American Medical Association. All rights reserved. The codes documented in this report are preliminary and upon coder review may  be revised to meet current compliance requirements. [name], MD [name] MD, MD 11/30/2022 8:03:39 AM This report has been signed electronically. Number of Addenda: 0 Note Initiated On: 11/30/2022 7:28 AM Scope Withdrawal Time: 0 hours 9 minutes 13 seconds  Total Procedure Duration: 0 hours 12 minutes 19 seconds  Estimated Blood Loss:  Estimated blood loss: none.      [facility]

## 2022-11-30 NOTE — Telephone Encounter (Signed)
Pt in a procedure in hospital- gave 30 day courtesy refill.  Requested Prescriptions  Pending Prescriptions Disp Refills   escitalopram (LEXAPRO) 20 MG tablet [Pharmacy Med Name: ESCITALOPRAM 20 MG TABLET] 30 tablet 0    Sig: TAKE 1 TABLET BY MOUTH EVERY DAY     Psychiatry:  Antidepressants - SSRI Failed - 11/30/2022  2:16 AM      Failed - Valid encounter within last 6 months    Recent Outpatient Visits           7 months ago Intractable migraine without aura and without status migrainosus   [place] [facility] [place], [place] W, NP   1 year ago Encounter for general adult medical examination with abnormal findings   [place] [facility] [place], Salvadore Oxford, NP   2 years ago Encounter for general adult medical examination with abnormal findings   [facility] [place], Salvadore Oxford, NP              Passed - Completed PHQ-2 or PHQ-9 in the last 360 days       buPROPion (WELLBUTRIN XL) 150 MG 24 hr tablet [Pharmacy Med Name: BUPROPION HCL XL 150 MG TABLET] 30 tablet 0    Sig: TAKE 1 TABLET BY MOUTH EVERY DAY     Psychiatry: Antidepressants - bupropion Failed - 11/30/2022  2:16 AM      Failed - Valid encounter within last 6 months    Recent Outpatient Visits           7 months ago Intractable migraine without aura and without status migrainosus   [place] [facility] [place], [place], NP   1 year ago Encounter for general adult medical examination with abnormal findings    [facility] [place], Salvadore Oxford, NP   2 years ago Encounter for general adult medical examination with abnormal findings    [facility] [place], Salvadore Oxford, NP              Passed - Cr in normal range and within 360 days    Creat  Date Value Ref Range Status  04/26/2022 0.84 0.50 - 0.99 mg/dL Final         Passed - AST in normal range and within 360 days    AST  Date Value Ref  Range Status  04/26/2022 13 10 - 35 U/L Final         Passed - ALT in normal range and within 360 days    ALT  Date Value Ref Range Status  04/26/2022 6 6 - 29 U/L Final         Passed - Completed PHQ-2 or PHQ-9 in the last 360 days      Passed - Last BP in normal range    BP Readings from Last 1 Encounters:  11/30/22 118/78

## 2022-11-30 NOTE — Transfer of Care (Signed)
Immediate Anesthesia Transfer of Care Note  Patient: Rachael Holloway  Procedure(s) Performed: COLONOSCOPY WITH PROPOFOL  Patient Location: PACU and Endoscopy Unit  Anesthesia Type:General  Level of Consciousness: awake  Airway & Oxygen Therapy: Patient Spontanous Breathing  Post-op Assessment: Report given to RN  Post vital signs: stable  Last Vitals:  Vitals Value Taken Time  BP 93/59 11/30/22 0807  Temp 36 C 11/30/22 0807  Pulse 75 11/30/22 0807  Resp 18 11/30/22 0807  SpO2 97 % 11/30/22 0807    Last Pain:  Vitals:   11/30/22 0807  TempSrc: Temporal  PainSc: Asleep         Complications: No notable events documented.

## 2022-11-30 NOTE — Anesthesia Postprocedure Evaluation (Signed)
Anesthesia Post Note  Patient: Rachael Holloway  Procedure(s) Performed: COLONOSCOPY WITH PROPOFOL  Patient location during evaluation: PACU Anesthesia Type: General Level of consciousness: awake Pain management: satisfactory to patient Vital Signs Assessment: post-procedure vital signs reviewed and stable Respiratory status: spontaneous breathing and aerosol facemask Cardiovascular status: blood pressure returned to baseline Anesthetic complications: no   No notable events documented.   Last Vitals:  Vitals:   11/30/22 0817 11/30/22 0827  BP: 113/79 118/78  Pulse: 73 70  Resp: 14 17  Temp:    SpO2: 99% 100%    Last Pain:  Vitals:   11/30/22 0817  TempSrc:   PainSc: 0-No pain                 VAN STAVEREN,Yasuko Lapage

## 2022-11-30 NOTE — H&P (Signed)
[name], MD [address], [address], [place], [place], [address] [address], [address], [place], [place], [address] Phone: [phone]  Fax: [phone]  Primary Care Physician:  Lorre Munroe, NP   Pre-Procedure History & Physical: HPI:  Rachael Holloway is a 47 y.o. female is here for an colonoscopy.   Past Medical History:  Diagnosis Date   Allergy    Chicken pox    Depression    Migraines    Thyroid disease    White coat hypertension     Past Surgical History:  Procedure Laterality Date   COLONOSCOPY WITH PROPOFOL N/A 11/27/2019   Procedure: COLONOSCOPY WITH PROPOFOL;  Surgeon: [name], MD;  Location: [facility] ENDOSCOPY;  Service: Gastroenterology;  Laterality: N/A;   EAR TUBE REMOVAL  1985   WISDOM TOOTH EXTRACTION      Prior to Admission medications   Medication Sig Start Date End Date Taking? Authorizing Provider  atorvastatin (LIPITOR) 10 MG tablet TAKE 1 TABLET BY MOUTH EVERY DAY 10/11/22  Yes Lorre Munroe, NP  buPROPion (WELLBUTRIN XL) 150 MG 24 hr tablet TAKE 1 TABLET BY MOUTH EVERY DAY 09/04/22  Yes Baity, Salvadore Oxford, NP  escitalopram (LEXAPRO) 20 MG tablet TAKE 1 TABLET BY MOUTH EVERY DAY 08/30/22  Yes Baity, Salvadore Oxford, NP  fluticasone (FLONASE) 50 MCG/ACT nasal spray Place 2 sprays into both nostrils daily. 07/16/22  Yes Lorre Munroe, NP  levothyroxine (SYNTHROID) 125 MCG tablet TAKE 1 TABLET BY MOUTH EVERY DAY 04/09/22  Yes Baity, Salvadore Oxford, NP  loratadine (CLARITIN) 10 MG tablet Take 10 mg by mouth daily.   Yes [provider]  Cholecalciferol (VITAMIN D3) 2000 UNITS TABS Take 2 capsules by mouth. 2 times a week    [provider]  Flaxseed, Linseed, (FLAXSEED OIL PO) Take 1 capsule by mouth 2 (two) times daily.    [provider]  Folate-B12-Intrinsic Factor (INTRINSI B12-FOLATE) 800-500-20 MCG-MCG-MG TABS Take 1 capsule by mouth daily.    [provider]  ibuprofen (ADVIL,MOTRIN) 400 MG tablet Take 400 mg by mouth  as needed.    [provider]  NIACINAMIDE-ZINC-COPPER-FA PO Take 1 capsule by mouth daily.    [provider]    Allergies as of 09/11/2022 - Review Complete 05/04/2022  Allergen Reaction Noted   Dust mite mixed allergen ext [mite (d. farinae)]  04/10/2017   Mold extract [trichophyton]  06/25/2014   Pollen extract  06/25/2014    Family History  Problem Relation Age of Onset   Healthy Mother    Dermatomyositis Mother    Heart disease Father    Hypertension Father    Rheum arthritis Sister    Stroke Maternal Grandmother    Heart disease Maternal Grandfather    Heart disease Paternal Grandmother    Stroke Paternal Grandfather    Breast cancer Cousin    Cancer Neg Hx    Diabetes Neg Hx     Social History   Socioeconomic History   Marital status: Married    Spouse name: Not on file   Number of children: Not on file   Years of education: Not on file   Highest education level: Not on file  Occupational History   Not on file  Tobacco Use   Smoking status: Never   Smokeless tobacco: Never  Vaping Use   Vaping status: Never Used  Substance and Sexual Activity   Alcohol use: Yes    Alcohol/week: 5.0 standard drinks of alcohol  Types: 5 Glasses of wine per week    Comment: moderate,none last 24hrs   Drug use: No   Sexual activity: Yes  Other Topics Concern   Not on file  Social History Narrative   Not on file   Social Determinants of Health   Financial Resource Strain: Not on file  Food Insecurity: Not on file  Transportation Needs: Not on file  Physical Activity: Not on file  Stress: Not on file  Social Connections: Not on file  Intimate Partner Violence: Not on file    Review of Systems: See HPI, otherwise negative ROS  Physical Exam: BP (!) 138/94   Pulse 93   Temp 97.8 F (36.6 C) (Temporal)   Resp 20   Ht 5\' 2"  (1.575 m)   Wt 64.1 kg   SpO2 99%   BMI 25.86 kg/m  General:   Alert,  pleasant and cooperative in NAD Head:   Normocephalic and atraumatic. Neck:  Supple; no masses or thyromegaly. Lungs:  Clear throughout to auscultation, normal respiratory effort.    Heart:  +S1, +S2, Regular rate and rhythm, No edema. Abdomen:  Soft, nontender and nondistended. Normal bowel sounds, without guarding, and without rebound.   Neurologic:  Alert and  oriented x4;  grossly normal neurologically.  Impression/Plan: KEMIA SIRAK is here for an colonoscopy to be performed for surveillance due to prior history of colon polyps   Risks, benefits, limitations, and alternatives regarding  colonoscopy have been reviewed with the patient.  Questions have been answered.  All parties agreeable.   [name], MD  11/30/2022, 7:41 AM

## 2022-12-24 ENCOUNTER — Other Ambulatory Visit: Payer: Self-pay | Admitting: Internal Medicine

## 2022-12-25 NOTE — Telephone Encounter (Signed)
Courtesy refill given, appointment needed.  Requested Prescriptions  Refused Prescriptions Disp Refills   buPROPion (WELLBUTRIN XL) 150 MG 24 hr tablet [Pharmacy Med Name: BUPROPION HCL XL 150 MG TABLET] 90 tablet 1    Sig: TAKE 1 TABLET BY MOUTH EVERY DAY     Psychiatry: Antidepressants - bupropion Failed - 12/24/2022  1:31 PM      Failed - Valid encounter within last 6 months    Recent Outpatient Visits           7 months ago Intractable migraine without aura and without status migrainosus   [facility] [place], [place] W, NP   1 year ago Encounter for general adult medical examination with abnormal findings   [place] [facility] [place], Salvadore Oxford, NP   2 years ago Encounter for general adult medical examination with abnormal findings   [place] [facility] [place], Salvadore Oxford, NP              Passed - Cr in normal range and within 360 days    Creat  Date Value Ref Range Status  04/26/2022 0.84 0.50 - 0.99 mg/dL Final         Passed - AST in normal range and within 360 days    AST  Date Value Ref Range Status  04/26/2022 13 10 - 35 U/L Final         Passed - ALT in normal range and within 360 days    ALT  Date Value Ref Range Status  04/26/2022 6 6 - 29 U/L Final         Passed - Completed PHQ-2 or PHQ-9 in the last 360 days      Passed - Last BP in normal range    BP Readings from Last 1 Encounters:  11/30/22 118/78          escitalopram (LEXAPRO) 20 MG tablet [Pharmacy Med Name: ESCITALOPRAM 20 MG TABLET] 90 tablet 1    Sig: TAKE 1 TABLET BY MOUTH EVERY DAY     Psychiatry:  Antidepressants - SSRI Failed - 12/24/2022  1:31 PM      Failed - Valid encounter within last 6 months    Recent Outpatient Visits           7 months ago Intractable migraine without aura and without status migrainosus   [place] [facility] [place], Salvadore Oxford, NP   1 year ago Encounter for general adult medical  examination with abnormal findings   [facility] [place], Salvadore Oxford, NP   2 years ago Encounter for general adult medical examination with abnormal findings    [facility] [place], [place], NP              Passed - Completed PHQ-2 or PHQ-9 in the last 360 days

## 2023-01-04 ENCOUNTER — Other Ambulatory Visit: Payer: Self-pay | Admitting: Internal Medicine

## 2023-01-04 NOTE — Telephone Encounter (Signed)
Requested Prescriptions  Pending Prescriptions Disp Refills   levothyroxine (SYNTHROID) 125 MCG tablet [Pharmacy Med Name: LEVOTHYROXINE 125 MCG TABLET] 90 tablet 1    Sig: TAKE 1 TABLET BY MOUTH EVERY DAY     Endocrinology:  Hypothyroid Agents Passed - 01/04/2023  2:27 AM      Passed - TSH in normal range and within 360 days    TSH  Date Value Ref Range Status  04/26/2022 3.43 mIU/L Final    Comment:              Reference Range .           > or = 20 Years  0.40-4.50 .                Pregnancy Ranges           First trimester    0.26-2.66           Second trimester   0.55-2.73           Third trimester    0.43-2.91          Passed - Valid encounter within last 12 months    Recent Outpatient Visits           8 months ago Intractable migraine without aura and without status migrainosus   [place] [place], Salvadore Oxford, NP   1 year ago Encounter for general adult medical examination with abnormal findings   [place] [facility] [place], Salvadore Oxford, NP   2 years ago Encounter for general adult medical examination with abnormal findings   [place] [facility] [place], Salvadore Oxford, NP       Future Appointments             In 1 month Baity, Salvadore Oxford, NP [place] [facility]

## 2023-01-14 ENCOUNTER — Ambulatory Visit (INDEPENDENT_AMBULATORY_CARE_PROVIDER_SITE_OTHER): Payer: Self-pay | Admitting: Physician Assistant

## 2023-01-14 ENCOUNTER — Encounter: Payer: Self-pay | Admitting: Physician Assistant

## 2023-01-14 ENCOUNTER — Other Ambulatory Visit: Payer: Self-pay

## 2023-01-14 ENCOUNTER — Other Ambulatory Visit: Payer: Self-pay | Admitting: Internal Medicine

## 2023-01-14 VITALS — BP 136/80 | HR 111 | Temp 98.7°F | Ht 62.0 in | Wt 140.0 lb

## 2023-01-14 DIAGNOSIS — R21 Rash and other nonspecific skin eruption: Secondary | ICD-10-CM

## 2023-01-14 MED ORDER — METHYLPREDNISOLONE 4 MG PO TBPK
ORAL_TABLET | ORAL | 0 refills | Status: AC
Start: 2023-01-14 — End: ?

## 2023-01-14 MED ORDER — BUPROPION HCL ER (XL) 150 MG PO TB24: 150.0000 mg | ORAL_TABLET | Freq: Every day | ORAL | 0 refills | Status: DC

## 2023-01-14 NOTE — Progress Notes (Signed)
[facility] [address] [place], [place] [address]   Office Visit Note  Patient Name: Rachael Holloway Date of Birth [id number]  Medical Record number [id number]  Date of Service: 01/14/2023  Chief Complaint  Patient presents with   rash    Patient c/o rash on right arm x 1 day. She states it has spread out farther since yesterday. Rash has developed around site where she donated blood this past Friday. Rash is not itchy bit bumps are slightly raised and red. She used OTC Aquaphor diaper cream with no improvement. She states she has not tried any new products or been working outdoors.     47 y/o F presents to the clinic for c/o a rash on R forearm since yesterday, but progressively spreading down to the wrist and upward to the upper arm. Pt states last week had her blood drawn, at the R antecubilal fossa and two days later onset of rash. Not itching. Not painful. Denies changes in diet, otc supplements, or hygiene products.       Current Medication:  Outpatient Encounter Medications as of 01/14/2023  Medication Sig   atorvastatin (LIPITOR) 10 MG tablet TAKE 1 TABLET BY MOUTH EVERY DAY   buPROPion (WELLBUTRIN XL) 150 MG 24 hr tablet Take 1 tablet (150 mg total) by mouth daily.   escitalopram (LEXAPRO) 20 MG tablet TAKE 1 TABLET BY MOUTH EVERY DAY   fluticasone (FLONASE) 50 MCG/ACT nasal spray Place 2 sprays into both nostrils daily. (Patient taking differently: Place 2 sprays into both nostrils daily as needed.)   ibuprofen (ADVIL,MOTRIN) 400 MG tablet Take 400 mg by mouth as needed.   levothyroxine (SYNTHROID) 125 MCG tablet TAKE 1 TABLET BY MOUTH EVERY DAY   loratadine (CLARITIN) 10 MG tablet Take 10 mg by mouth daily as needed.   methylPREDNISolone (MEDROL DOSEPAK) 4 MG TBPK tablet Take as directed   Multiple Vitamin (MULTIVITAMIN) capsule Take 1 capsule by mouth daily.   [DISCONTINUED] Cholecalciferol (VITAMIN D3) 2000 UNITS TABS Take 2 capsules by mouth. 2 times a week    [DISCONTINUED] Flaxseed, Linseed, (FLAXSEED OIL PO) Take 1 capsule by mouth 2 (two) times daily.   [DISCONTINUED] Folate-B12-Intrinsic Factor (INTRINSI B12-FOLATE) 800-500-20 MCG-MCG-MG TABS Take 1 capsule by mouth daily.   [DISCONTINUED] NIACINAMIDE-ZINC-COPPER-FA PO Take 1 capsule by mouth daily.   No facility-administered encounter medications on file as of 01/14/2023.      Medical History: Past Medical History:  Diagnosis Date   Allergy    Chicken pox    Depression    Migraines    Thyroid disease    White coat hypertension      Vital Signs: BP 136/80   Pulse (!) 111   Temp 98.7 F (37.1 C)   Ht 5\' 2"  (1.575 m)   Wt 140 lb (63.5 kg)   SpO2 97%   BMI 25.61 kg/m    Review of Systems  Constitutional: Negative.   HENT: Negative.    Respiratory: Negative.    Cardiovascular: Negative.   Skin:  Positive for rash.  Allergic/Immunologic: Positive for environmental allergies.    Physical Exam Constitutional:      Appearance: Normal appearance.  HENT:     Head: Normocephalic and atraumatic.     Right Ear: External ear normal.     Left Ear: External ear normal.  Eyes:     Extraocular Movements: Extraocular movements intact.  Skin:    General: Skin is warm and dry.     Findings:  Rash present. Rash is macular and papular. Rash is not crusting, nodular, purpuric, pustular, scaling, urticarial or vesicular.          Comments: R arm: scattered multiple macuopapular rash from upper arm to wrist.   Neurological:     Mental Status: She is alert and oriented to person, place, and time.  Psychiatric:        Mood and Affect: Mood normal.        Behavior: Behavior normal.       Assessment/Plan:  1. Rash and nonspecific skin eruption - methylPREDNISolone (MEDROL DOSEPAK) 4 MG TBPK tablet; Take as directed  Dispense: 1 each; Refill: 0  Apply ice/cold compresses for onset of itching Take Benadryl at bedtime. Start oral steroids if symptoms don't improve or if  symptoms worsen. May start oral steroids tomorrow. Pt verbalized understanding and in agreement.    General Counseling: naraly goodloe understanding of the findings of todays visit and agrees with plan of treatment. I have discussed any further diagnostic evaluation that may be needed or ordered today. We also reviewed her medications today. she has been encouraged to call the office with any questions or concerns that should arise related to todays visit.    Time spent:30 Minutes    Gilberto Better, [place] Physician Assistant

## 2023-01-17 ENCOUNTER — Encounter: Payer: Self-pay | Admitting: Physician Assistant

## 2023-01-18 ENCOUNTER — Ambulatory Visit: Payer: BC Managed Care – PPO | Admitting: Psychology

## 2023-01-18 DIAGNOSIS — F3289 Other specified depressive episodes: Secondary | ICD-10-CM | POA: Diagnosis not present

## 2023-01-18 DIAGNOSIS — F418 Other specified anxiety disorders: Secondary | ICD-10-CM | POA: Diagnosis not present

## 2023-01-18 NOTE — Progress Notes (Signed)
[facility] Counselor/Therapist Progress Note  Patient ID: VONETTA BOLE, MRN: [id number]   Date: 01/18/23  Time Spent: 10:05  am - 10:53 am : 48 Minutes  Treatment Type: Individual Therapy.  Reported Symptoms: Depression and anxiety.  Mental Status Exam: Appearance:  Neat and Well Groomed     Behavior: Appropriate  Motor: Normal  Speech/Language:  Clear and Coherent  Affect: Appropriate  Mood: normal  Thought process: normal  Thought content:   WNL  Sensory/Perceptual disturbances:   WNL  Orientation: oriented to person, place, time/date, and situation  Attention: Good  Concentration: Good  Memory: WNL  Fund of knowledge:  Good  Insight:   Good  Judgment:  Good  Impulse Control: Good   Risk Assessment: Danger to Self:  No Self-injurious Behavior: No Danger to Others: No Duty to Warn:no Physical Aggression / Violence:No  Access to Firearms a concern: No  Gang Involvement:No   Subjective:   Rachael Holloway participated from home, via video, is aware of the limitations of tele-sessions, and consented to treatment. Therapist participated from home office.  Rachael Holloway reviewed the events of the past week. She noted doing reasonably well this summer but noted having an uncomfortable week of meetings without windows or sunlight. She noted frustration regarding the inefficiency of the meetings, the length of meetings. She noted being angry, snippy, mean, and sarcastic. She noted identifying the shift in her mood and being able to "name the feelings" but noted having difficulty "interrupting" this. She noted taking breaks to moderate her frustration. She noted her mood improving since this time. We worked identifying ways to manage this distress including managing her self-talk. She noted often feeling overstimulated during long days. We worked on identifying ways to manage this and behaviors to engage in that might provide some balance including being more mindful,  taking breaks, and being purposeful and "slowing down". She noted things going well in her relationship with her husband. She noted working on exercising regularly, be more mindful and present, spend time with her husband, and thinking positively about the semester. She noted some traction in her mother agreeing to live in senior living housing. She noted her worry regarding her mother's adjustment to this. She noted often feeling lonely on birthdays and holidays due to her family living in [place]. She noted not being able to see her family, in person, very often. We worked on processing this during the session. Rachael Holloway was engaged and motivated during the session and expressed commitment towards goals.   Interventions: CBT  Diagnosis:  Other depression  Other specified anxiety disorders  Psychiatric Treatment: Yes , Via PCP.    Treatment Plan:  Client Abilities/Strengths Rachael Holloway is intelligent, well education, and motivated for change.   Support System: Family  Client Treatment Preferences Outpatient  Client Statement of Needs Rachael Holloway would like to manage symptoms, be more mindful, engage in relaxation, manage self-talk, challenge negative thoughts and feelings, and engage in self-care.   Treatment Level Weekly  Symptoms  Depression: feeling down, poor sleep, lethargy, feeling bad about self.   (Status: improved) Anxiety: Feeling anxious, worrying about different things, trouble relaxing.    (Status: improved)  Goals:   Rachael Holloway experiences symptoms of depression and anxiety.  Treatment plan signed and available on s-drive:  No, pending signature.    Target Date: 10/19/23 Frequency: Weekly  Progress: 0 Modality: individual    Therapist will provide referrals for additional resources as appropriate.  Therapist will provide psycho-education regarding Rachael Holloway's diagnosis and  corresponding treatment approaches and interventions. Licensed Clinical Social Worker, [place], LCSW will support the patient's ability to achieve the goals identified. will employ CBT, BA, Problem-solving, Solution Focused, Mindfulness,  coping skills, & other evidenced-based practices will be used to promote progress towards healthy functioning to help manage decrease symptoms associated with her diagnosis.   Reduce overall level, frequency, and intensity of the feelings of depression, anxiety and panic evidenced by decreased overall symptoms from 6 to 7 days/week to 0 to 1 days/week per client report for at least 3 consecutive months. Verbally express understanding of the relationship between feelings of depression, anxiety and their impact on thinking patterns and behaviors. Verbalize an understanding of the role that distorted thinking plays in creating fears, excessive worry, and ruminations.    Rachael Holloway participated in the creation of the treatment plan)    Rachael Ovens, LCSW

## 2023-02-02 ENCOUNTER — Other Ambulatory Visit: Payer: Self-pay | Admitting: Internal Medicine

## 2023-02-04 MED ORDER — ESCITALOPRAM OXALATE 20 MG PO TABS: 20.0000 mg | ORAL_TABLET | Freq: Every day | ORAL | 0 refills | Status: DC

## 2023-02-05 ENCOUNTER — Other Ambulatory Visit: Payer: Self-pay | Admitting: Internal Medicine

## 2023-02-06 NOTE — Telephone Encounter (Signed)
Appt made 30 day refill- no further refills until seen in office Requested Prescriptions  Pending Prescriptions Disp Refills   buPROPion (WELLBUTRIN XL) 150 MG 24 hr tablet [Pharmacy Med Name: BUPROPION HCL XL 150 MG TABLET] 30 tablet 0    Sig: TAKE 1 TABLET BY MOUTH EVERY DAY     Psychiatry: Antidepressants - bupropion Failed - 02/05/2023  8:31 AM      Failed - Valid encounter within last 6 months    Recent Outpatient Visits           9 months ago Intractable migraine without aura and without status migrainosus   [place] [facility] [place], [place], NP   1 year ago Encounter for general adult medical examination with abnormal findings   [facility] [place], Salvadore Oxford, NP   2 years ago Encounter for general adult medical examination with abnormal findings   [place] [facility] [place], Salvadore Oxford, NP       Future Appointments             In 1 week Sam[place] Si, Salvadore Oxford, NP [facility]            Passed - Cr in normal range and within 360 days    Creat  Date Value Ref Range Status  04/26/2022 0.84 0.50 - 0.99 mg/dL Final         Passed - AST in normal range and within 360 days    AST  Date Value Ref Range Status  04/26/2022 13 10 - 35 U/L Final         Passed - ALT in normal range and within 360 days    ALT  Date Value Ref Range Status  04/26/2022 6 6 - 29 U/L Final         Passed - Completed PHQ-2 or PHQ-9 in the last 360 days      Passed - Last BP in normal range    BP Readings from Last 1 Encounters:  01/14/23 136/80

## 2023-02-15 ENCOUNTER — Ambulatory Visit (INDEPENDENT_AMBULATORY_CARE_PROVIDER_SITE_OTHER): Payer: BC Managed Care – PPO | Admitting: Internal Medicine

## 2023-02-15 VITALS — BP 118/80 | HR 82 | Temp 96.6°F | Wt 146.0 lb

## 2023-02-15 DIAGNOSIS — Z0001 Encounter for general adult medical examination with abnormal findings: Secondary | ICD-10-CM

## 2023-02-15 DIAGNOSIS — R7309 Other abnormal glucose: Secondary | ICD-10-CM | POA: Diagnosis not present

## 2023-02-15 DIAGNOSIS — E039 Hypothyroidism, unspecified: Secondary | ICD-10-CM

## 2023-02-15 DIAGNOSIS — Z23 Encounter for immunization: Secondary | ICD-10-CM | POA: Diagnosis not present

## 2023-02-15 DIAGNOSIS — E782 Mixed hyperlipidemia: Secondary | ICD-10-CM | POA: Diagnosis not present

## 2023-02-15 DIAGNOSIS — Z6826 Body mass index (BMI) 26.0-26.9, adult: Secondary | ICD-10-CM

## 2023-02-15 DIAGNOSIS — E663 Overweight: Secondary | ICD-10-CM

## 2023-02-15 NOTE — Progress Notes (Signed)
Subjective:    Patient ID: Rachael Holloway, female    DOB: Sep 15, 1975, 47 y.o.   MRN: [id number]  HPI  Patient presents to clinic today for her annual exam.  Flu: 03/2022 Tetanus: 09/2020 COVID: X 6 Pap smear: 08/2019 Mammogram: 06/2022 Colon screening: 11/2022 Vision screening: annually Dentist: biannually  Diet: She does eat meat. She consumes fruits and veggies. She tries to avoid fried foods. She drinks mostly water, hot tea, coffee. Exercise: Walking, 4 x week  Review of Systems     Past Medical History:  Diagnosis Date   Allergy    Chicken pox    Depression    Migraines    Thyroid disease    White coat hypertension     Current Outpatient Medications  Medication Sig Dispense Refill   atorvastatin (LIPITOR) 10 MG tablet TAKE 1 TABLET BY MOUTH EVERY DAY 90 tablet 1   buPROPion (WELLBUTRIN XL) 150 MG 24 hr tablet TAKE 1 TABLET BY MOUTH EVERY DAY 30 tablet 0   escitalopram (LEXAPRO) 20 MG tablet Take 1 tablet (20 mg total) by mouth daily. 30 tablet 0   fluticasone (FLONASE) 50 MCG/ACT nasal spray Place 2 sprays into both nostrils daily. (Patient taking differently: Place 2 sprays into both nostrils daily as needed.) 16 g 6   ibuprofen (ADVIL,MOTRIN) 400 MG tablet Take 400 mg by mouth as needed.     levothyroxine (SYNTHROID) 125 MCG tablet TAKE 1 TABLET BY MOUTH EVERY DAY 90 tablet 1   loratadine (CLARITIN) 10 MG tablet Take 10 mg by mouth daily as needed.     methylPREDNISolone (MEDROL DOSEPAK) 4 MG TBPK tablet Take as directed 1 each 0   Multiple Vitamin (MULTIVITAMIN) capsule Take 1 capsule by mouth daily.     No current facility-administered medications for this visit.    Allergies  Allergen Reactions   Dust Mite Mixed Allergen Ext [Mite (D. Farinae)]    Mold Extract [Trichophyton]    Pollen Extract     Family History  Problem Relation Age of Onset   Healthy Mother    Dermatomyositis Mother    Heart disease Father    Hypertension Father    Rheum  arthritis Sister    Stroke Maternal Grandmother    Heart disease Maternal Grandfather    Heart disease Paternal Grandmother    Stroke Paternal Grandfather    Breast cancer Cousin    Cancer Neg Hx    Diabetes Neg Hx     Social History   Socioeconomic History   Marital status: Married    Spouse name: Not on file   Number of children: Not on file   Years of education: Not on file   Highest education level: Doctorate  Occupational History   Not on file  Tobacco Use   Smoking status: Never   Smokeless tobacco: Never  Vaping Use   Vaping status: Never Used  Substance and Sexual Activity   Alcohol use: Yes    Alcohol/week: 5.0 standard drinks of alcohol    Types: 5 Glasses of wine per week    Comment: moderate,none last 24hrs   Drug use: No   Sexual activity: Yes  Other Topics Concern   Not on file  Social History Narrative   Not on file   Social Determinants of Health   Financial Resource Strain: Low Risk  (02/11/2023)   Overall Financial Resource Strain (CARDIA)    Difficulty of Paying Living Expenses: Not hard at all  Food Insecurity: No Food  Insecurity (02/11/2023)   Hunger Vital Sign    Worried About Running Out of Food in the Last Year: Never true    Ran Out of Food in the Last Year: Never true  Transportation Needs: No Transportation Needs (02/11/2023)   PRAPARE - Administrator, Civil Service (Medical): No    Lack of Transportation (Non-Medical): No  Physical Activity: Sufficiently Active (02/11/2023)   Exercise Vital Sign    Days of Exercise per Week: 4 days    Minutes of Exercise per Session: 50 min  Stress: No Stress Concern Present (02/11/2023)   [employer] of Occupational Health - Occupational Stress Questionnaire    Feeling of Stress : Only a little  Social Connections: Moderately Integrated (02/11/2023)   Social Connection and Isolation Panel [NHANES]    Frequency of Communication with Friends and Family: Twice a week    Frequency of  Social Gatherings with Friends and Family: Once a week    Attends Religious Services: Never    Database administrator or Organizations: Yes    Attends Banker Meetings: 1 to 4 times per year    Marital Status: Married  Catering manager Violence: Not on file     Constitutional: Patient reports intermittent headaches.  Denies fever, malaise, fatigue, or abrupt weight changes.  HEENT: Denies eye pain, eye redness, ear pain, ringing in the ears, wax buildup, runny nose, nasal congestion, bloody nose, or sore throat. Respiratory: Denies difficulty breathing, shortness of breath, cough or sputum production.   Cardiovascular: Patient reports intermittent palpitations.  Denies chest pain, chest tightness, or swelling in the hands or feet.  Gastrointestinal: Denies abdominal pain, bloating, constipation, diarrhea or blood in the stool.  GU: Patient reports irregular periods.  Denies urgency, frequency, pain with urination, burning sensation, blood in urine, odor or discharge. Musculoskeletal: Denies decrease in range of motion, difficulty with gait, muscle pain or joint pain and swelling.  Skin: Denies redness, rashes, lesions or ulcercations.  Neurological: Denies dizziness, difficulty with memory, difficulty with speech or problems with balance and coordination.  Psych: Patient has a history of anxiety and depression.  Denies SI/HI.  No other specific complaints in a complete review of systems (except as listed in HPI above).  Objective:   Physical Exam  BP 118/80 (BP Location: Left Arm, Patient Position: Sitting, Cuff Size: Normal)   Pulse 82   Temp (!) 96.6 F (35.9 C) (Temporal)   Wt 146 lb (66.2 kg)   SpO2 98%   BMI 26.70 kg/m   Wt Readings from Last 3 Encounters:  01/14/23 140 lb (63.5 kg)  11/30/22 141 lb 6.4 oz (64.1 kg)  05/04/22 141 lb (64 kg)    General: Appears her stated age, overweight, in NAD. Skin: Warm, dry and intact. No rashes, lesions or ulcerations  noted. HEENT: Head: normal shape and size; Eyes: sclera white, no icterus, conjunctiva pink, PERRLA and EOMs intact;  Cardiovascular: Normal rate and rhythm. S1,S2 noted.  No murmur, rubs or gallops noted. No JVD or BLE edema.  Pulmonary/Chest: Normal effort and positive vesicular breath sounds. No respiratory distress. No wheezes, rales or ronchi noted.  Abdomen: Soft and nontender. Normal bowel sounds.  Musculoskeletal: Strength 5/5 BUE/BLE. No difficulty with gait.  Neurological: Alert and oriented. Cranial nerves II-XII grossly intact. Coordination normal.  Psychiatric: Mood and affect normal. Behavior is normal. Judgment and thought content normal.    BMET    Component Value Date/Time   NA 138 04/26/2022 1417  NA 139 10/13/2020 0807   K 4.5 04/26/2022 1417   CL 105 04/26/2022 1417   CO2 26 04/26/2022 1417   GLUCOSE 95 04/26/2022 1417   BUN 9 04/26/2022 1417   BUN 8 10/13/2020 0807   CREATININE 0.84 04/26/2022 1417   CALCIUM 8.6 04/26/2022 1417   GFRNONAA 86 11/06/2018 1433   GFRAA 99 11/06/2018 1433    Lipid Panel     Component Value Date/Time   CHOL 150 04/26/2022 1417   CHOL 216 (H) 10/13/2020 0807   TRIG 177 (H) 04/26/2022 1417   HDL 50 04/26/2022 1417   HDL 56 10/13/2020 0807   CHOLHDL 3.0 04/26/2022 1417   VLDL 34.6 10/08/2019 1023   LDLCALC 73 04/26/2022 1417    CBC    Component Value Date/Time   WBC 7.4 10/19/2021 0846   RBC 4.37 10/19/2021 0846   HGB 14.0 10/19/2021 0846   HGB 14.1 10/13/2020 0807   HCT 41.4 10/19/2021 0846   HCT 42.6 10/13/2020 0807   PLT 258 10/19/2021 0846   PLT 280 10/13/2020 0807   MCV 94.7 10/19/2021 0846   MCV 95 10/13/2020 0807   MCH 32.0 10/19/2021 0846   MCHC 33.8 10/19/2021 0846   RDW 12.1 10/19/2021 0846   RDW 11.8 10/13/2020 0807   LYMPHSABS 2.2 10/13/2020 0807   EOSABS 0.2 10/13/2020 0807   BASOSABS 0.1 10/13/2020 0807    Hgb A1C No results found for: "HGBA1C"          Assessment & Plan:    Preventative health maintenance:  Flu shot today Tetanus UTD COVID-vaccine UTD Pap smear UTD Mammogram UTD Colon screening UTD Encouraged her to consume a balanced diet and exercise regimen Advised her to see an eye doctor and dentist annually We will check CBC, c-Met, TSH, free T4, lipid, A1c today  RTC in 6 months, follow-up chronic conditions Nicki Reaper, NP

## 2023-02-15 NOTE — Assessment & Plan Note (Signed)
Encouraged diet and exercise for weight loss ?

## 2023-02-15 NOTE — Patient Instructions (Signed)

## 2023-02-16 LAB — CBC
HCT: 36.8 % (ref 35.0–45.0)
Hemoglobin: 11.5 g/dL — ABNORMAL LOW (ref 11.7–15.5)
MCH: 28 pg (ref 27.0–33.0)
MCHC: 31.3 g/dL — ABNORMAL LOW (ref 32.0–36.0)
MCV: 89.5 fL (ref 80.0–100.0)
MPV: 11 fL (ref 7.5–12.5)
Platelets: 352 10*3/uL (ref 140–400)
RBC: 4.11 10*6/uL (ref 3.80–5.10)
RDW: 11.9 % (ref 11.0–15.0)
WBC: 6.2 10*3/uL (ref 3.8–10.8)

## 2023-02-16 LAB — COMPLETE METABOLIC PANEL WITH GFR
AG Ratio: 2 (calc) (ref 1.0–2.5)
ALT: 15 U/L (ref 6–29)
AST: 18 U/L (ref 10–35)
Albumin: 4.2 g/dL (ref 3.6–5.1)
Alkaline phosphatase (APISO): 89 U/L (ref 31–125)
BUN: 9 mg/dL (ref 7–25)
CO2: 27 mmol/L (ref 20–32)
Calcium: 9.1 mg/dL (ref 8.6–10.2)
Chloride: 104 mmol/L (ref 98–110)
Creat: 0.78 mg/dL (ref 0.50–0.99)
Globulin: 2.1 g/dL (ref 1.9–3.7)
Glucose, Bld: 91 mg/dL (ref 65–99)
Potassium: 4.6 mmol/L (ref 3.5–5.3)
Sodium: 138 mmol/L (ref 135–146)
Total Bilirubin: 0.4 mg/dL (ref 0.2–1.2)
Total Protein: 6.3 g/dL (ref 6.1–8.1)
eGFR: 93 mL/min/{1.73_m2} (ref >=60)

## 2023-02-16 LAB — LIPID PANEL
Cholesterol: 146 mg/dL (ref <200)
HDL: 55 mg/dL (ref >=50)
LDL Cholesterol (Calc): 64 mg/dL
Non-HDL Cholesterol (Calc): 91 mg/dL (ref <130)
Total CHOL/HDL Ratio: 2.7 (calc) (ref <5.0)
Triglycerides: 209 mg/dL — ABNORMAL HIGH (ref <150)

## 2023-02-16 LAB — T4, FREE: Free T4: 1 ng/dL (ref 0.8–1.8)

## 2023-02-16 LAB — HEMOGLOBIN A1C
Hgb A1c MFr Bld: 5.5 %{Hb} (ref <5.7)
Mean Plasma Glucose: 111 mg/dL
eAG (mmol/L): 6.2 mmol/L

## 2023-02-16 LAB — TSH: TSH: 1.56 m[IU]/L

## 2023-02-28 ENCOUNTER — Other Ambulatory Visit: Payer: Self-pay | Admitting: Internal Medicine

## 2023-02-28 NOTE — Telephone Encounter (Signed)
Requested medication (s) are due for refill today: yes  Requested medication (s) are on the active medication list: yes  Last refill:  02/06/23 #30 0 refills  Future visit scheduled: yes in 5 months  Notes to clinic:  Pharmacy comment: REQUEST FOR 90 DAYS PRESCRIPTION.       Requested Prescriptions  Pending Prescriptions Disp Refills   buPROPion (WELLBUTRIN XL) 150 MG 24 hr tablet [Pharmacy Med Name: BUPROPION HCL XL 150 MG TABLET] 90 tablet 1    Sig: TAKE 1 TABLET BY MOUTH EVERY DAY     Psychiatry: Antidepressants - bupropion Passed - 02/28/2023  9:31 AM      Passed - Cr in normal range and within 360 days    Creat  Date Value Ref Range Status  02/15/2023 0.78 0.50 - 0.99 mg/dL Final         Passed - AST in normal range and within 360 days    AST  Date Value Ref Range Status  02/15/2023 18 10 - 35 U/L Final         Passed - ALT in normal range and within 360 days    ALT  Date Value Ref Range Status  02/15/2023 15 6 - 29 U/L Final         Passed - Completed PHQ-2 or PHQ-9 in the last 360 days      Passed - Last BP in normal range    BP Readings from Last 1 Encounters:  02/15/23 118/80         Passed - Valid encounter within last 6 months    Recent Outpatient Visits           1 week ago Need for influenza vaccination   [facility] [place], [place], NP   10 months ago Intractable migraine without aura and without status migrainosus   [place] [facility] [place], Salvadore Oxford, NP   1 year ago Encounter for general adult medical examination with abnormal findings   [place] [facility] [place], Salvadore Oxford, NP   2 years ago Encounter for general adult medical examination with abnormal findings   [facility] [place], Salvadore Oxford, NP       Future Appointments             In 5 months Baity, Salvadore Oxford, NP [facility], [facility]

## 2023-03-05 ENCOUNTER — Other Ambulatory Visit: Payer: Self-pay | Admitting: Internal Medicine

## 2023-03-05 NOTE — Telephone Encounter (Signed)
Requested Prescriptions  Pending Prescriptions Disp Refills   escitalopram (LEXAPRO) 20 MG tablet [Pharmacy Med Name: ESCITALOPRAM 20 MG TABLET] 30 tablet 0    Sig: TAKE 1 TABLET BY MOUTH EVERY DAY     Psychiatry:  Antidepressants - SSRI Passed - 03/05/2023  2:25 AM      Passed - Completed PHQ-2 or PHQ-9 in the last 360 days      Passed - Valid encounter within last 6 months    Recent Outpatient Visits           2 weeks ago Need for influenza vaccination   [place] [facility] [place], [place] W, NP   10 months ago Intractable migraine without aura and without status migrainosus   [place] [facility] [place], Salvadore Oxford, NP   1 year ago Encounter for general adult medical examination with abnormal findings   [place] [facility] [place], Salvadore Oxford, NP   2 years ago Encounter for general adult medical examination with abnormal findings   [place] [facility] [place] [place], Salvadore Oxford, NP       Future Appointments             In 5 months Baity, Salvadore Oxford, NP [facility], [facility]

## 2023-04-10 ENCOUNTER — Other Ambulatory Visit: Payer: Self-pay | Admitting: Internal Medicine

## 2023-04-11 NOTE — Telephone Encounter (Signed)
Requested Prescriptions  Pending Prescriptions Disp Refills   atorvastatin (LIPITOR) 10 MG tablet [Pharmacy Med Name: ATORVASTATIN 10 MG TABLET] 90 tablet 1    Sig: TAKE 1 TABLET BY MOUTH EVERY DAY     Cardiovascular:  Antilipid - Statins Failed - 04/10/2023  1:50 AM      Failed - Lipid Panel in normal range within the last 12 months    Cholesterol, Total  Date Value Ref Range Status  10/13/2020 216 (H) 100 - 199 mg/dL Final   Cholesterol  Date Value Ref Range Status  02/15/2023 146 <200 mg/dL Final   LDL Cholesterol (Calc)  Date Value Ref Range Status  02/15/2023 64 mg/dL (calc) Final    Comment:    Reference range: <100 . Desirable range <100 mg/dL for primary prevention;   <70 mg/dL for patients with CHD or diabetic patients  with > or = 2 CHD risk factors. Marland Kitchen LDL-C is now calculated using the Martin-Hopkins  calculation, which is a validated novel method providing  better accuracy than the Friedewald equation in the  estimation of LDL-C.  Horald Pollen et al. Lenox Ahr. 9562;130(86): 2061-2068  (http://education.[facility].com/faq/FAQ164)    Direct LDL  Date Value Ref Range Status  09/09/2019 150.0 mg/dL Final    Comment:    Optimal:  <100 mg/dLNear or Above Optimal:  100-129 mg/dLBorderline High:  130-159 mg/dLHigh:  160-189 mg/dLVery High:  >190 mg/dL   HDL  Date Value Ref Range Status  02/15/2023 55 > OR = 50 mg/dL Final  57/84/6962 56 >95 mg/dL Final   Triglycerides  Date Value Ref Range Status  02/15/2023 209 (H) <150 mg/dL Final    Comment:    . If a non-fasting specimen was collected, consider repeat triglyceride testing on a fasting specimen if clinically indicated.  Perry Mount et al. J. of Clin. Lipidol. 2015;9:129-169. Marland Kitchen          Passed - Patient is not pregnant      Passed - Valid encounter within last 12 months    Recent Outpatient Visits           1 month ago Need for influenza vaccination   [place] [facility] [place],  [place] W, NP   11 months ago Intractable migraine without aura and without status migrainosus   [place] [facility] [place], Salvadore Oxford, NP   1 year ago Encounter for general adult medical examination with abnormal findings   [facility] [place], Salvadore Oxford, NP   2 years ago Encounter for general adult medical examination with abnormal findings   [place] [facility] [place], Salvadore Oxford, NP       Future Appointments             In 3 months Baity, Salvadore Oxford, NP [place] [facility], [facility]

## 2023-04-29 ENCOUNTER — Ambulatory Visit: Payer: BC Managed Care – PPO | Admitting: Psychology

## 2023-04-29 DIAGNOSIS — F418 Other specified anxiety disorders: Secondary | ICD-10-CM

## 2023-04-29 DIAGNOSIS — F3289 Other specified depressive episodes: Secondary | ICD-10-CM

## 2023-04-29 NOTE — Progress Notes (Signed)
[facility] Counselor/Therapist Progress Note  Patient ID: Rachael Holloway, MRN: [id number]   Date: 04/29/23  Time Spent: 2:32  pm - 3:22 pm : 50  Minutes  Treatment Type: Individual Therapy.  Reported Symptoms: Depression and anxiety.  Mental Status Exam: Appearance:  Neat and Well Groomed     Behavior: Appropriate  Motor: Normal  Speech/Language:  Clear and Coherent  Affect: Appropriate  Mood: normal  Thought process: normal  Thought content:   WNL  Sensory/Perceptual disturbances:   WNL  Orientation: oriented to person, place, time/date, and situation  Attention: Good  Concentration: Good  Memory: WNL  Fund of knowledge:  Good  Insight:   Good  Judgment:  Good  Impulse Control: Good   Risk Assessment: Danger to Self:  No Self-injurious Behavior: No Danger to Others: No Duty to Warn:no Physical Aggression / Violence:No  Access to Firearms a concern: No  Gang Involvement:No   Subjective:   Rachael Holloway participated from home, via video, is aware of the limitations of tele-sessions, and consented to treatment. Therapist participated from office.  Rachael Holloway reviewed the events of the past week.Rachael Holloway noted generally feeling on edge. She noted work related stressors regarding a particular cohort not getting along. We explored these stressors during the session. We processed her experience and worked on identifying boundaries going forward and areas of control and lack of control. 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 We explored this and could not identify any evidence for this. We worked on challenging this during the session and therapist encouraged Rachael Holloway to continue challenging jumps to conclusions. We worked on identifying positives in day-to-day and therapist encouraged Rachael Holloway to be mindful of stressors while highlighting the positives occurring day-to-day. Rachael Holloway was engaged and motivated during the session. She  expressed commitment towards goals. Therapist praised Rachael Holloway and provided supportive therapy. A follow-up was scheduled for continued treatment.     Interventions: CBT  Diagnosis:  Other depression  Other specified anxiety disorders  Psychiatric Treatment: Yes , Via PCP.    Treatment Plan:  Client Abilities/Strengths [name] is intelligent, well education, and motivated for change.   Support System: Family  Client Treatment Preferences Outpatient  Client Statement of Needs Rachael Holloway would like to manage symptoms, be more mindful, engage in relaxation, manage self-talk, challenge negative thoughts and feelings, and engage in self-care.   Treatment Level Weekly  Symptoms  Depression: feeling down, poor sleep, lethargy, feeling bad about self.   (Status: improved) Anxiety: Feeling anxious, worrying about different things, trouble relaxing.    (Status: improved)  Goals:   Rachael Holloway experiences symptoms of depression and anxiety.  Treatment plan signed and available on s-drive:  No, pending signature.    Target Date: 10/19/23 Frequency: Weekly  Progress: 0 Modality: individual    Therapist will provide referrals for additional resources as appropriate.  Therapist will provide psycho-education regarding Rachael Holloway's diagnosis and corresponding treatment approaches and interventions. Licensed Clinical Social Worker, [place], LCSW will support the patient's ability to achieve the goals identified. will employ CBT, BA, Problem-solving, Solution Focused, Mindfulness,  coping skills, & other evidenced-based practices will be used to promote progress towards healthy functioning to help manage decrease symptoms associated with her diagnosis.   Reduce overall level, frequency, and intensity of the feelings of depression, anxiety and panic evidenced by decreased overall symptoms from 6 to 7 days/week to 0 to 1 days/week per client report for at least 3 consecutive months. Verbally  express understanding  of the relationship between feelings of depression, anxiety and their impact on thinking patterns and behaviors. Verbalize an understanding of the role that distorted thinking plays in creating fears, excessive worry, and ruminations.    Rachael Holloway participated in the creation of the treatment plan)    [name], LCSW

## 2023-05-03 ENCOUNTER — Other Ambulatory Visit: Payer: Self-pay | Admitting: Internal Medicine

## 2023-05-03 NOTE — Telephone Encounter (Signed)
Requested Prescriptions  Pending Prescriptions Disp Refills   fluticasone (FLONASE) 50 MCG/ACT nasal spray [Pharmacy Med Name: FLUTICASONE PROP 50 MCG SPRAY] 48 mL 0    Sig: SPRAY 2 SPRAYS INTO EACH NOSTRIL EVERY DAY     Ear, Nose, and Throat: Nasal Preparations - Corticosteroids Passed - 05/03/2023  4:52 PM      Passed - Valid encounter within last 12 months    Recent Outpatient Visits           2 months ago Need for influenza vaccination   [place] [facility] [place], [place], NP   12 months ago Intractable migraine without aura and without status migrainosus   [place] [facility] [place], Salvadore Oxford, NP   1 year ago Encounter for general adult medical examination with abnormal findings   [place] [facility] [place], Salvadore Oxford, NP   2 years ago Encounter for general adult medical examination with abnormal findings   [place] [facility] [place], Salvadore Oxford, NP       Future Appointments             In 3 months Baity, Salvadore Oxford, NP [facility], [facility]

## 2023-05-06 ENCOUNTER — Other Ambulatory Visit: Payer: Self-pay | Admitting: Internal Medicine

## 2023-05-07 NOTE — Telephone Encounter (Signed)
Requested Prescriptions  Refused Prescriptions Disp Refills   levothyroxine (SYNTHROID) 125 MCG tablet [Pharmacy Med Name: LEVOTHYROXINE 125 MCG TABLET] 90 tablet 1    Sig: TAKE 1 TABLET BY MOUTH EVERY DAY     Endocrinology:  Hypothyroid Agents Passed - 05/07/2023 12:13 PM      Passed - TSH in normal range and within 360 days    TSH  Date Value Ref Range Status  02/15/2023 1.56 mIU/L Final    Comment:              Reference Range .           > or = 20 Years  0.40-4.50 .                Pregnancy Ranges           First trimester    0.26-2.66           Second trimester   0.55-2.73           Third trimester    0.43-2.91          Passed - Valid encounter within last 12 months    Recent Outpatient Visits           2 months ago Need for influenza vaccination   [place] [facility] [place], [place], NP   1 year ago Intractable migraine without aura and without status migrainosus   [facility] [place], Salvadore Oxford, NP   1 year ago Encounter for general adult medical examination with abnormal findings   [place] [facility] [place], Salvadore Oxford, NP   2 years ago Encounter for general adult medical examination with abnormal findings   [place] [facility] [place], Salvadore Oxford, NP       Future Appointments             In 3 months Baity, Salvadore Oxford, NP [place] [facility], [facility]

## 2023-05-23 ENCOUNTER — Other Ambulatory Visit: Payer: Self-pay | Admitting: Internal Medicine

## 2023-05-23 DIAGNOSIS — Z1231 Encounter for screening mammogram for malignant neoplasm of breast: Secondary | ICD-10-CM

## 2023-06-14 ENCOUNTER — Ambulatory Visit: Payer: BC Managed Care – PPO | Admitting: Psychology

## 2023-06-14 DIAGNOSIS — F418 Other specified anxiety disorders: Secondary | ICD-10-CM

## 2023-06-14 DIAGNOSIS — F3289 Other specified depressive episodes: Secondary | ICD-10-CM

## 2023-06-14 NOTE — Progress Notes (Signed)
[facility] Counselor/Therapist Progress Note  Patient ID: GIOVANNI BATH, MRN: [id number]   Date: 06/14/23  Time Spent: 2:04  pm -2:48pm : 44 Minutes  Treatment Type: Individual Therapy.  Reported Symptoms: Depression and anxiety.  Mental Status Exam: Appearance:  Neat and Well Groomed     Behavior: Appropriate  Motor: Normal  Speech/Language:  Clear and Coherent  Affect: Appropriate  Mood: normal  Thought process: normal  Thought content:   WNL  Sensory/Perceptual disturbances:   WNL  Orientation: oriented to person, place, time/date, and situation  Attention: Good  Concentration: Good  Memory: WNL  Fund of knowledge:  Good  Insight:   Good  Judgment:  Good  Impulse Control: Good   Risk Assessment: Danger to Self:  No Self-injurious Behavior: No Danger to Others: No Duty to Warn:no Physical Aggression / Violence:No  Access to Firearms a concern: No  Gang Involvement:No   Subjective:   Rachael Holloway participated from home, via video, is aware of the limitations of tele-sessions, and consented to treatment. Therapist participated from home office.  Rachael Holloway reviewed the events of the past week. Rachael Holloway noted experiencing long days, during the past few weeks, due to a play at work. She noted being peer reviewed during that time and noted this being stressful. She noted some disturbed sleep and rumination. We worked on exploring her experience at work and noted this being frustrating when managing people. 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 We explored this during the session. We worked on reviewing coping skills including managing rumination. She noted worry about her mother who is aged and is slated to move into a community but there are some delays. She noted having less time with her partner due to different work schedules. She noted working on  "resetting" from a work project. We reviewed coping during the session and therapist encouraged time in this area. She noted things gong well in her marriage. She noted positive upcoming events. Therapist praised Rachael Holloway for her effort to engage in enjoyable activities and self-care. Therapist validated Rachael Holloway's feelings and experience. Rachael Holloway was engaged and motivated during the session. A follow-up was scheduled for continued treatment.   Interventions: CBT  Diagnosis:  Other depression  Other specified anxiety disorders  Psychiatric Treatment: Yes , Via PCP.    Treatment Plan:  Client Abilities/Strengths Rachael Holloway is intelligent, well education, and motivated for change.   Support System: Family  Client Treatment Preferences Outpatient  Client Statement of Needs Rachael Holloway would like to manage symptoms, be more mindful, engage in relaxation, manage self-talk, challenge negative thoughts and feelings, and engage in self-care.   Treatment Level Weekly  Symptoms  Depression: feeling down, poor sleep, lethargy, feeling bad about self.   (Status: improved) Anxiety: Feeling anxious, worrying about different things, trouble relaxing.    (Status: improved)  Goals:   Rachael Holloway experiences symptoms of depression and anxiety.  Treatment plan signed and available on s-drive:  No, pending signature.    Target Date: 10/19/23 Frequency: Weekly  Progress: 25% Modality: individual    Therapist will provide referrals for additional resources as appropriate.  Therapist will provide psycho-education regarding Rachael Holloway's diagnosis and corresponding treatment approaches and interventions. Licensed Clinical Social Worker, Rachael [place], Rachael Holloway will support the patient's ability to achieve the goals identified. will employ CBT, BA, Problem-solving, Solution Focused, Mindfulness,  coping skills, & other evidenced-based practices will be  used to promote progress towards healthy functioning to help  manage decrease symptoms associated with her diagnosis.   Reduce overall level, frequency, and intensity of the feelings of depression, anxiety and panic evidenced by decreased overall symptoms from 6 to 7 days/week to 0 to 1 days/week per client report for at least 3 consecutive months. Verbally express understanding of the relationship between feelings of depression, anxiety and their impact on thinking patterns and behaviors. Verbalize an understanding of the role that distorted thinking plays in creating fears, excessive worry, and ruminations.    Rachael Holloway participated in the creation of the treatment plan)    Rachael Ovens, Rachael Holloway

## 2023-07-04 ENCOUNTER — Other Ambulatory Visit: Payer: Self-pay | Admitting: Internal Medicine

## 2023-07-05 NOTE — Telephone Encounter (Signed)
Requested Prescriptions  Pending Prescriptions Disp Refills   levothyroxine (SYNTHROID) 125 MCG tablet [Pharmacy Med Name: LEVOTHYROXINE 125 MCG TABLET] 90 tablet 1    Sig: TAKE 1 TABLET BY MOUTH EVERY DAY     Endocrinology:  Hypothyroid Agents Passed - 07/05/2023  9:32 AM      Passed - TSH in normal range and within 360 days    TSH  Date Value Ref Range Status  02/15/2023 1.56 mIU/L Final    Comment:              Reference Range .           > or = 20 Years  0.40-4.50 .                Pregnancy Ranges           First trimester    0.26-2.66           Second trimester   0.55-2.73           Third trimester    0.43-2.91          Passed - Valid encounter within last 12 months    Recent Outpatient Visits           4 months ago Need for influenza vaccination   [place] [facility] [place], Salvadore Oxford, NP   1 year ago Intractable migraine without aura and without status migrainosus   [facility] [place], [place], NP   1 year ago Encounter for general adult medical examination with abnormal findings   [place] [facility] [place], Salvadore Oxford, NP   2 years ago Encounter for general adult medical examination with abnormal findings   [place] [facility] [place], Salvadore Oxford, NP       Future Appointments             In 1 month [place], Salvadore Oxford, NP [place] [facility], [facility]             buPROPion (WELLBUTRIN XL) 150 MG 24 hr tablet [Pharmacy Med Name: BUPROPION HCL XL 150 MG TABLET] 90 tablet 1    Sig: TAKE 1 TABLET BY MOUTH EVERY DAY     Psychiatry: Antidepressants - bupropion Passed - 07/05/2023  9:32 AM      Passed - Cr in normal range and within 360 days    Creat  Date Value Ref Range Status  02/15/2023 0.78 0.50 - 0.99 mg/dL Final         Passed - AST in normal range and within 360 days    AST  Date Value Ref Range Status  02/15/2023 18 10 - 35 U/L Final         Passed - ALT  in normal range and within 360 days    ALT  Date Value Ref Range Status  02/15/2023 15 6 - 29 U/L Final         Passed - Completed PHQ-2 or PHQ-9 in the last 360 days      Passed - Last BP in normal range    BP Readings from Last 1 Encounters:  02/15/23 118/80         Passed - Valid encounter within last 6 months    Recent Outpatient Visits           4 months ago Need for influenza vaccination   [facility] [place], [place],  NP   1 year ago Intractable migraine without aura and without status migrainosus   [place] [facility] [place], Salvadore Oxford, NP   1 year ago Encounter for general adult medical examination with abnormal findings   [facility] [place], Salvadore Oxford, NP   2 years ago Encounter for general adult medical examination with abnormal findings   [place] [facility] [place], Salvadore Oxford, NP       Future Appointments             In 1 month [place], Salvadore Oxford, NP [place] [facility], [facility]

## 2023-07-08 ENCOUNTER — Ambulatory Visit
Admission: RE | Admit: 2023-07-08 | Discharge: 2023-07-08 | Disposition: A | Discharge status: Home / Self Care | Payer: BC Managed Care – PPO | Source: Ambulatory Visit | Attending: Internal Medicine | Admitting: Internal Medicine

## 2023-07-08 DIAGNOSIS — Z1231 Encounter for screening mammogram for malignant neoplasm of breast: Secondary | ICD-10-CM | POA: Diagnosis not present

## 2023-07-12 ENCOUNTER — Other Ambulatory Visit: Payer: Self-pay | Admitting: Internal Medicine

## 2023-07-12 DIAGNOSIS — R928 Other abnormal and inconclusive findings on diagnostic imaging of breast: Secondary | ICD-10-CM

## 2023-07-18 ENCOUNTER — Ambulatory Visit
Admission: RE | Admit: 2023-07-18 | Discharge: 2023-07-18 | Disposition: A | Discharge status: Home / Self Care | Payer: BC Managed Care – PPO | Source: Ambulatory Visit | Attending: Internal Medicine | Admitting: Internal Medicine

## 2023-07-18 DIAGNOSIS — N6002 Solitary cyst of left breast: Secondary | ICD-10-CM | POA: Diagnosis not present

## 2023-07-18 DIAGNOSIS — N6321 Unspecified lump in the left breast, upper outer quadrant: Secondary | ICD-10-CM | POA: Diagnosis not present

## 2023-07-18 DIAGNOSIS — R92333 Mammographic heterogeneous density, bilateral breasts: Secondary | ICD-10-CM | POA: Diagnosis not present

## 2023-07-18 DIAGNOSIS — R928 Other abnormal and inconclusive findings on diagnostic imaging of breast: Secondary | ICD-10-CM | POA: Diagnosis not present

## 2023-07-18 DIAGNOSIS — N6325 Unspecified lump in the left breast, overlapping quadrants: Secondary | ICD-10-CM | POA: Diagnosis not present

## 2023-08-05 ENCOUNTER — Encounter: Payer: Self-pay | Admitting: Internal Medicine

## 2023-08-05 ENCOUNTER — Ambulatory Visit: Payer: BC Managed Care – PPO | Admitting: Internal Medicine

## 2023-08-05 VITALS — BP 122/82 | Ht 62.0 in | Wt 147.2 lb

## 2023-08-05 DIAGNOSIS — G43019 Migraine without aura, intractable, without status migrainosus: Secondary | ICD-10-CM | POA: Diagnosis not present

## 2023-08-05 DIAGNOSIS — E782 Mixed hyperlipidemia: Secondary | ICD-10-CM

## 2023-08-05 DIAGNOSIS — E663 Overweight: Secondary | ICD-10-CM

## 2023-08-05 DIAGNOSIS — Z6826 Body mass index (BMI) 26.0-26.9, adult: Secondary | ICD-10-CM

## 2023-08-05 DIAGNOSIS — F419 Anxiety disorder, unspecified: Secondary | ICD-10-CM | POA: Diagnosis not present

## 2023-08-05 DIAGNOSIS — N951 Menopausal and female climacteric states: Secondary | ICD-10-CM | POA: Insufficient documentation

## 2023-08-05 DIAGNOSIS — E039 Hypothyroidism, unspecified: Secondary | ICD-10-CM

## 2023-08-05 DIAGNOSIS — R7309 Other abnormal glucose: Secondary | ICD-10-CM | POA: Diagnosis not present

## 2023-08-05 DIAGNOSIS — F32A Depression, unspecified: Secondary | ICD-10-CM

## 2023-08-05 MED ORDER — ESCITALOPRAM OXALATE 20 MG PO TABS: 20.0000 mg | ORAL_TABLET | Freq: Every day | ORAL | Status: DC

## 2023-08-05 MED ORDER — VENLAFAXINE HCL ER 75 MG PO CP24: 75.0000 mg | ORAL_CAPSULE | Freq: Every day | ORAL | 1 refills | Status: DC

## 2023-08-05 NOTE — Assessment & Plan Note (Signed)
 Lipid profile today We will obtain CT cardiac score she is concerned about her risk Encouraged her to consume a low fat diet Continue atorvastatin

## 2023-08-05 NOTE — Patient Instructions (Signed)
 Perimenopause: What to Know Perimenopause is the time in your life when your levels of estrogen start to go down. Estrogen is the female hormone made by your ovaries. Perimenopause can start 2-8 years before menopause. It can cause changes to your menstrual period. During this time, your ovaries may or may not make an egg. In many cases, you can still get pregnant. What are the causes? Perimenopause is a natural change in your homone levels that happens as you get older. What increases the risk? You're more likely to start perimenopause Girtrude Enslin if: You have an abnormal growth (tumor) of the pituitary gland in your brain. You have a disease that affects your ovaries. You've had certain treatments for cancer. These include: Chemotherapy. Hormone therapy. Radiation therapy on the area between your hips (pelvis). You smoke a lot or drink a lot of alcohol. Other family members have gone through menopause Andrena Margerum. What are the signs or symptoms? Symptoms are unique to each person. You may have: Hot flashes. Irregular periods. Night sweats. Changes in how you feel about sex. You may have less of a sex drive or feel more discomfort around your sexuality. Vaginal dryness. Headaches. Mood swings. Other symptoms may include: Depression. This is when you feel sad or hopeless. Trouble sleeping. Memory problems or trouble focusing. Irritability. This means getting annoyed easily. Tiredness. Weight gain. Anxiety. This is feeling worried or nervous. You can also have trouble getting pregnant. How is this diagnosed? You may be diagnosed based on: Your medical history. An exam. Your age. Your history of menstrual periods. Your symptoms. Hormone tests. How is this treated? In some cases, no treatment is needed. Talk with your health care provider about if you should get treated. Treatments may include: Menopausal hormone therapy (MHT). Medicines to treat certain  symptoms. Acupuncture. Vitamin or herbal supplements. Before you start treatment, let your provider know if you or anyone in your family has or has had: Heart disease. Breast cancer. Blood clots. Diabetes. Osteoporosis. Follow these instructions at home: Eating and drinking  Eat a balanced diet. It should include: Fresh fruits and vegetables. Whole grains. Soybeans. Eggs. Lean meat. Low-fat dairy. To help prevent hot flashes, stay away from: Alcohol. Drinks with caffeine in them. Spicy foods. Lifestyle Do not smoke, vape, or use nicotine or tobacco. Get at least 30 minutes of physical activity on 5 or more days each week. Get 7-8 hours of sleep each night. Dress in layers that can be taken off if you have a hot flash. Find ways to manage stress. You may want to try: Deep breathing. Meditation. Writing in a journal. General instructions  Take your medicines only as told. Keep track of your periods. Track: When they happen. How heavy they are. How long they last. How much time passes between periods. Keep track of your symptoms. Track: When they start. How often you have them. How long they last. Use vaginal lubricants or moisturizers. These can help with: Vaginal dryness. Comfort during sex. You can still get pregnant if you're having any periods. Make sure you use birth control if you don't want to get pregnant. Contact a health care provider if: You have a very heavy period or pass blood clots. Your period lasts more than 2 days longer than normal. Your period comes back sooner than 21 days. You bleed after having sex. You have pain during sex. You have pain when you pee. You get very bad headaches. You have trouble with your eyesight. Get help right away if: You  have chest pain. You have trouble breathing. You have trouble talking. You have very bad depression. This information is not intended to replace advice given to you by your health care provider.  Make sure you discuss any questions you have with your health care provider. Document Revised: 01/10/2023 Document Reviewed: 01/10/2023 Elsevier Patient Education  2024 [employer].

## 2023-08-05 NOTE — Assessment & Plan Note (Signed)
 Encouraged diet and exercise for weight loss ?

## 2023-08-05 NOTE — Assessment & Plan Note (Signed)
 Given her menopause symptoms, will try to change escitalopram to venlafaxine Continue bupropion Support offered

## 2023-08-05 NOTE — Assessment & Plan Note (Signed)
 Will change her escitalopram to venlafaxine Continue to monitor

## 2023-08-05 NOTE — Assessment & Plan Note (Signed)
Continue caffeine, ibuprofen and cool compresses as needed

## 2023-08-05 NOTE — Assessment & Plan Note (Signed)
 TSH and Free t4 today Continue current dose of levothyroxine

## 2023-08-05 NOTE — Progress Notes (Signed)
 Subjective:    Patient ID: Rachael Holloway, female    DOB: [birth year], 48 y.o.   MRN: [id number]  HPI  Patient presents to clinic today for 50-month follow-up of chronic conditions.  Hypothyroidism: She denies any issues on her current dose of levothyroxine.  She does not follow with endocrinology.  HLD: Her last LDL was 64, triglycerides 2 9, 01/2023.  She denies myalgias on atorvastatin.  She tries to consume a low-fat diet.  Anxiety and depression: Chronic, managed on escitalopram and bupropion.  She is currently seeing a therapist.  She denies SI/HI.  Migraines: These occur rarely.  Triggered by allergies and hormones.  She takes caffeine, ibuprofen and cool compresses as needed with good relief of symptoms.  She does not follow with neurology.  Review of Systems     Past Medical History:  Diagnosis Date   Allergy    Chicken pox    Depression    Migraines    Thyroid disease    White coat hypertension     Current Outpatient Medications  Medication Sig Dispense Refill   atorvastatin (LIPITOR) 10 MG tablet TAKE 1 TABLET BY MOUTH EVERY DAY 90 tablet 1   buPROPion (WELLBUTRIN XL) 150 MG 24 hr tablet TAKE 1 TABLET BY MOUTH EVERY DAY 90 tablet 1   escitalopram (LEXAPRO) 20 MG tablet TAKE 1 TABLET BY MOUTH EVERY DAY 30 tablet 5   fluticasone (FLONASE) 50 MCG/ACT nasal spray SPRAY 2 SPRAYS INTO EACH NOSTRIL EVERY DAY 48 mL 1   ibuprofen (ADVIL,MOTRIN) 400 MG tablet Take 400 mg by mouth as needed.     levothyroxine (SYNTHROID) 125 MCG tablet TAKE 1 TABLET BY MOUTH EVERY DAY 90 tablet 1   loratadine (CLARITIN) 10 MG tablet Take 10 mg by mouth daily as needed.     Multiple Vitamin (MULTIVITAMIN) capsule Take 1 capsule by mouth daily.     No current facility-administered medications for this visit.    Allergies  Allergen Reactions   Dust Mite Mixed Allergen Ext [Mite (D. Farinae)]    Mold Extract [Trichophyton]    Pollen Extract     Family History  Problem Relation Age of  Onset   Healthy Mother    Dermatomyositis Mother    Heart disease Father    Hypertension Father    Rheum arthritis Sister    Stroke Maternal Grandmother    Heart disease Maternal Grandfather    Heart disease Paternal Grandmother    Stroke Paternal Grandfather    Breast cancer Cousin    Cancer Neg Hx    Diabetes Neg Hx     Social History   Socioeconomic History   Marital status: Married    Spouse name: Not on file   Number of children: Not on file   Years of education: Not on file   Highest education level: Doctorate  Occupational History   Not on file  Tobacco Use   Smoking status: Never   Smokeless tobacco: Never  Vaping Use   Vaping status: Never Used  Substance and Sexual Activity   Alcohol use: Yes    Alcohol/week: 5.0 standard drinks of alcohol    Types: 5 Glasses of wine per week    Comment: moderate,none last 24hrs   Drug use: No   Sexual activity: Yes  Other Topics Concern   Not on file  Social History Narrative   Not on file   Social Drivers of Health   Financial Resource Strain: Low Risk  (08/01/2023)  Overall Financial Resource Strain (CARDIA)    Difficulty of Paying Living Expenses: Not hard at all  Food Insecurity: No Food Insecurity (08/01/2023)   Hunger Vital Sign    Worried About Running Out of Food in the Last Year: Never true    Ran Out of Food in the Last Year: Never true  Transportation Needs: No Transportation Needs (08/01/2023)   PRAPARE - Administrator, Civil Service (Medical): No    Lack of Transportation (Non-Medical): No  Physical Activity: Sufficiently Active (08/01/2023)   Exercise Vital Sign    Days of Exercise per Week: 4 days    Minutes of Exercise per Session: 50 min  Stress: Stress Concern Present (08/01/2023)   [employer] of Occupational Health - Occupational Stress Questionnaire    Feeling of Stress : To some extent  Social Connections: Moderately Integrated (08/01/2023)   Social Connection and Isolation  Panel [NHANES]    Frequency of Communication with Friends and Family: Once a week    Frequency of Social Gatherings with Friends and Family: Twice a week    Attends Religious Services: Never    Database administrator or Organizations: Yes    Attends Engineer, structural: 1 to 4 times per year    Marital Status: Married  Catering manager Violence: Not on file     Constitutional: Patient reports headaches.  Denies fever, malaise, fatigue, or abrupt weight changes.  HEENT: Denies eye pain, eye redness, ear pain, ringing in the ears, wax buildup, runny nose, nasal congestion, bloody nose, or sore throat. Respiratory: Denies difficulty breathing, shortness of breath, cough or sputum production.   Cardiovascular: Pt reports palpitations. Denies chest pain, chest tightness, or swelling in the hands or feet.  Gastrointestinal: Denies abdominal pain, bloating, constipation, diarrhea or blood in the stool.  GU: Patient reports decreased libido, irregular menses.  Denies urgency, frequency, pain with urination, burning sensation, blood in urine, odor or discharge. Musculoskeletal: Denies decrease in range of motion, difficulty with gait, muscle pain or joint pain and swelling.  Skin: Denies redness, rashes, lesions or ulcercations.  Neurological: Pt reports insomnia. Denies dizziness, difficulty with memory, difficulty with speech or problems with balance and coordination.  Psych: Patient has a history of anxiety and depression.  Denies  SI/HI.  No other specific complaints in a complete review of systems (except as listed in HPI above).  Objective:   Physical Exam  BP 122/82 (BP Location: Right Arm, Patient Position: Sitting, Cuff Size: Normal)   Ht 5\' 2"  (1.575 m)   Wt 147 lb 3.2 oz (66.8 kg)   LMP 05/08/2023 (Approximate)   BMI 26.92 kg/m    Wt Readings from Last 3 Encounters:  02/15/23 146 lb (66.2 kg)  01/14/23 140 lb (63.5 kg)  11/30/22 141 lb 6.4 oz (64.1 kg)    General:  Appears her stated age, overweight, in NAD. HEENT: Head: normal shape and size; Eyes: sclera white, no icterus, conjunctiva pink, PERRLA and EOMs intact;  Neck:  Neck supple, trachea midline. No masses, lumps or thyromegaly present.  Cardiovascular: Normal rate and rhythm. S1,S2 noted.  No murmur, rubs or gallops noted. No JVD or BLE edema. No carotid bruits noted. Pulmonary/Chest: Normal effort and positive vesicular breath sounds. No respiratory distress. No wheezes, rales or ronchi noted.  Musculoskeletal: No difficulty with gait.  Neurological: Alert and oriented. Psychiatric: Mood and affect normal. Behavior is normal. Judgment and thought content normal.    BMET    Component Value  Date/Time   NA 138 02/15/2023 1056   NA 139 10/13/2020 0807   K 4.6 02/15/2023 1056   CL 104 02/15/2023 1056   CO2 27 02/15/2023 1056   GLUCOSE 91 02/15/2023 1056   BUN 9 02/15/2023 1056   BUN 8 10/13/2020 0807   CREATININE 0.78 02/15/2023 1056   CALCIUM 9.1 02/15/2023 1056   GFRNONAA 86 11/06/2018 1433   GFRAA 99 11/06/2018 1433    Lipid Panel     Component Value Date/Time   CHOL 146 02/15/2023 1056   CHOL 216 (H) 10/13/2020 0807   TRIG 209 (H) 02/15/2023 1056   HDL 55 02/15/2023 1056   HDL 56 10/13/2020 0807   CHOLHDL 2.7 02/15/2023 1056   VLDL 34.6 10/08/2019 1023   LDLCALC 64 02/15/2023 1056    CBC    Component Value Date/Time   WBC 6.2 02/15/2023 1056   RBC 4.11 02/15/2023 1056   HGB 11.5 (L) 02/15/2023 1056   HGB 14.1 10/13/2020 0807   HCT 36.8 02/15/2023 1056   HCT 42.6 10/13/2020 0807   PLT 352 02/15/2023 1056   PLT 280 10/13/2020 0807   MCV 89.5 02/15/2023 1056   MCV 95 10/13/2020 0807   MCH 28.0 02/15/2023 1056   MCHC 31.3 (L) 02/15/2023 1056   RDW 11.9 02/15/2023 1056   RDW 11.8 10/13/2020 0807   LYMPHSABS 2.2 10/13/2020 0807   EOSABS 0.2 10/13/2020 0807   BASOSABS 0.1 10/13/2020 0807    Hgb A1C Lab Results  Component Value Date   HGBA1C 5.5 02/15/2023            Assessment & Plan:    RTC in 6 months for your annual exam Nicki Reaper, NP

## 2023-08-06 ENCOUNTER — Encounter: Payer: Self-pay | Admitting: Internal Medicine

## 2023-08-06 LAB — HEMOGLOBIN A1C
Hgb A1c MFr Bld: 5.4 %{Hb} (ref <5.7)
Mean Plasma Glucose: 108 mg/dL
eAG (mmol/L): 6 mmol/L

## 2023-08-06 LAB — LIPID PANEL
Cholesterol: 176 mg/dL (ref <200)
HDL: 59 mg/dL (ref >=50)
LDL Cholesterol (Calc): 88 mg/dL
Non-HDL Cholesterol (Calc): 117 mg/dL (ref <130)
Total CHOL/HDL Ratio: 3 (calc) (ref <5.0)
Triglycerides: 191 mg/dL — ABNORMAL HIGH (ref <150)

## 2023-08-06 LAB — COMPLETE METABOLIC PANEL WITH GFR
AG Ratio: 1.9 (calc) (ref 1.0–2.5)
ALT: 13 U/L (ref 6–29)
AST: 18 U/L (ref 10–35)
Albumin: 4.4 g/dL (ref 3.6–5.1)
Alkaline phosphatase (APISO): 78 U/L (ref 31–125)
BUN: 8 mg/dL (ref 7–25)
CO2: 28 mmol/L (ref 20–32)
Calcium: 9.3 mg/dL (ref 8.6–10.2)
Chloride: 104 mmol/L (ref 98–110)
Creat: 0.88 mg/dL (ref 0.50–0.99)
Globulin: 2.3 g/dL (ref 1.9–3.7)
Glucose, Bld: 93 mg/dL (ref 65–99)
Potassium: 4.2 mmol/L (ref 3.5–5.3)
Sodium: 139 mmol/L (ref 135–146)
Total Bilirubin: 0.4 mg/dL (ref 0.2–1.2)
Total Protein: 6.7 g/dL (ref 6.1–8.1)
eGFR: 81 mL/min/{1.73_m2} (ref >=60)

## 2023-08-06 LAB — CBC
HCT: 36.6 % (ref 35.0–45.0)
Hemoglobin: 11.5 g/dL — ABNORMAL LOW (ref 11.7–15.5)
MCH: 26.8 pg — ABNORMAL LOW (ref 27.0–33.0)
MCHC: 31.4 g/dL — ABNORMAL LOW (ref 32.0–36.0)
MCV: 85.3 fL (ref 80.0–100.0)
MPV: 10.9 fL (ref 7.5–12.5)
Platelets: 327 10*3/uL (ref 140–400)
RBC: 4.29 10*6/uL (ref 3.80–5.10)
RDW: 13.5 % (ref 11.0–15.0)
WBC: 4.9 10*3/uL (ref 3.8–10.8)

## 2023-08-06 LAB — TSH: TSH: 0.83 m[IU]/L

## 2023-08-06 LAB — T4, FREE: Free T4: 1.6 ng/dL (ref 0.8–1.8)

## 2023-08-09 ENCOUNTER — Ambulatory Visit
Admission: RE | Admit: 2023-08-09 | Discharge: 2023-08-09 | Disposition: A | Discharge status: Home / Self Care | Payer: Self-pay | Source: Ambulatory Visit | Attending: Internal Medicine | Admitting: Internal Medicine

## 2023-08-09 DIAGNOSIS — E782 Mixed hyperlipidemia: Secondary | ICD-10-CM | POA: Insufficient documentation

## 2023-08-12 ENCOUNTER — Encounter: Payer: Self-pay | Admitting: Internal Medicine

## 2023-08-30 ENCOUNTER — Ambulatory Visit: Payer: BC Managed Care – PPO | Admitting: Psychology

## 2023-08-30 DIAGNOSIS — F3289 Other specified depressive episodes: Secondary | ICD-10-CM | POA: Diagnosis not present

## 2023-08-30 DIAGNOSIS — F418 Other specified anxiety disorders: Secondary | ICD-10-CM

## 2023-08-30 NOTE — Progress Notes (Signed)
 [facility] Counselor/Therapist Progress Note  Patient ID: Rachael Holloway, MRN: [id number]   Date: 08/30/23  Time Spent: 2:01 pm - 2:55 pm : 54 Minutes  Treatment Type: Individual Therapy.  Reported Symptoms: Depression and anxiety.  Mental Status Exam: Appearance:  Neat and Well Groomed     Behavior: Appropriate  Motor: Normal  Speech/Language:  Clear and Coherent  Affect: Appropriate  Mood: anxious and dysthymic  Thought process: normal  Thought content:   WNL  Sensory/Perceptual disturbances:   WNL  Orientation: oriented to person, place, time/date, and situation  Attention: Good  Concentration: Good  Memory: WNL  Fund of knowledge:  Good  Insight:   Good  Judgment:  Good  Impulse Control: Good   Risk Assessment: Danger to Self:  No Self-injurious Behavior: No Danger to Others: No Duty to Warn:no Physical Aggression / Violence:No  Access to Firearms a concern: No  Gang Involvement:No   Subjective:   Rachael Holloway participated from home, via video, is aware of the limitations of tele-sessions, and consented to treatment. Therapist participated from home office.  Rachael Holloway reviewed the events of the past week. Rachael Holloway noted numerous stressors including work stressors, her husband's recent injury, her mother's stagnation regarding her living plans, dealing with perimenopause (insomnia and palpitations), and anxiety about traveling abroad and politics. She noted feeling like she is riding a roller coaster. She noted having a recent change in her medication and is now being prescribed Rachael Holloway. We worked one exploring her current stressors and how she is coping with this. She noted being pulled in various directions and noted this being stressful. 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 She noted often being tearful during times of stress. We worked on identifying areas of control  and lack of control, discussed establishing worry time. We worked on highlighting positives and therapist encouraged Rachael Holloway to work on engaging in gratitude on a daily basis. We practiced this, during the session. Therapist normalized Rachael Holloway's feelings during the session. Therapist validated Rachael Holloway's feelings and experience, encouraged self-care, boundaries, and engagement in self-care. Rachael Holloway was engaged and motivated during the session. A follow-up was scheduled for continued treatment, which she benefits from.   Interventions: CBT  Diagnosis:  Other depression  Other specified anxiety disorders  Psychiatric Treatment: Yes , Via PCP.    Treatment Plan:  Client Abilities/Strengths Rachael Holloway is intelligent, well education, and motivated for change.   Support System: Family  Client Treatment Preferences Outpatient  Client Statement of Needs Rachael Holloway would like to manage symptoms, be more mindful, engage in relaxation, manage self-talk, challenge negative thoughts and feelings, and engage in self-care.   Treatment Level Weekly  Symptoms  Depression: feeling down, poor sleep, lethargy, feeling bad about self.   (Status: maintained) Anxiety: Feeling anxious, worrying about different things, trouble relaxing.    (Status: maintained)  Goals:   Rachael Holloway experiences symptoms of depression and anxiety.  Treatment plan signed and available on s-drive:  No, pending signature.    Target Date: 10/19/23 Frequency: Weekly  Progress: 25% Modality: individual    Therapist will provide referrals for additional resources as appropriate.  Therapist will provide psycho-education regarding [name]'s diagnosis and corresponding treatment approaches and interventions. Licensed Clinical Social Worker, [place], LCSW will support the patient's ability to achieve the goals identified. will employ CBT, BA, Problem-solving, Solution Focused, Mindfulness,  coping skills, & other evidenced-based  practices will be used to promote progress towards healthy  functioning to help manage decrease symptoms associated with her diagnosis.   Reduce overall level, frequency, and intensity of the feelings of depression, anxiety and panic evidenced by decreased overall symptoms from 6 to 7 days/week to 0 to 1 days/week per client report for at least 3 consecutive months. Verbally express understanding of the relationship between feelings of depression, anxiety and their impact on thinking patterns and behaviors. Verbalize an understanding of the role that distorted thinking plays in creating fears, excessive worry, and ruminations.    Rachael Holloway participated in the creation of the treatment plan)    [name], LCSW

## 2023-09-23 ENCOUNTER — Encounter: Payer: Self-pay | Admitting: Internal Medicine

## 2023-09-23 MED ORDER — BUPROPION HCL ER (XL) 150 MG PO TB24: 150.0000 mg | ORAL_TABLET | Freq: Every day | ORAL | 1 refills | Status: DC

## 2023-10-02 ENCOUNTER — Other Ambulatory Visit: Payer: Self-pay | Admitting: Internal Medicine

## 2023-10-03 NOTE — Telephone Encounter (Signed)
 Requested Prescriptions  Pending Prescriptions Disp Refills   atorvastatin  (LIPITOR) 10 MG tablet [Pharmacy Med Name: ATORVASTATIN  10 MG TABLET] 90 tablet 1    Sig: TAKE 1 TABLET BY MOUTH EVERY DAY     Cardiovascular:  Antilipid - Statins Failed - 10/03/2023  1:35 PM      Failed - Lipid Panel in normal range within the last 12 months    Cholesterol, Total  Date Value Ref Range Status  10/13/2020 216 (H) 100 - 199 mg/dL Final   Cholesterol  Date Value Ref Range Status  08/05/2023 176 <200 mg/dL Final   LDL Cholesterol (Calc)  Date Value Ref Range Status  08/05/2023 88 mg/dL (calc) Final    Comment:    Reference range: <100 . Desirable range <100 mg/dL for primary prevention;   <70 mg/dL for patients with CHD or diabetic patients  with > or = 2 CHD risk factors. Aaron Aas LDL-C is now calculated using the Martin-Hopkins  calculation, which is a validated novel method providing  better accuracy than the Friedewald equation in the  estimation of LDL-C.  Melinda Sprawls et al. Erroll Heard. 1610;960(45): 2061-2068  (http://education.[employer].com/faq/FAQ164)    Direct LDL  Date Value Ref Range Status  09/09/2019 150.0 mg/dL Final    Comment:    Optimal:  <100 mg/dLNear or Above Optimal:  100-129 mg/dLBorderline High:  130-159 mg/dLHigh:  160-189 mg/dLVery High:  >190 mg/dL   HDL  Date Value Ref Range Status  08/05/2023 59 > OR = 50 mg/dL Final  40/98/1191 56 >47 mg/dL Final   Triglycerides  Date Value Ref Range Status  08/05/2023 191 (H) <150 mg/dL Final         Passed - Patient is not pregnant      Passed - Valid encounter within last 12 months    Recent Outpatient Visits           1 month ago Acquired hypothyroidism   [facility] [place], [place], NP

## 2023-10-31 ENCOUNTER — Other Ambulatory Visit: Payer: Self-pay | Admitting: Internal Medicine

## 2023-11-01 NOTE — Telephone Encounter (Signed)
 Requested Prescriptions  Pending Prescriptions Disp Refills   fluticasone  (FLONASE ) 50 MCG/ACT nasal spray [Pharmacy Med Name: FLUTICASONE  PROP 50 MCG SPRAY] 48 mL 1    Sig: SPRAY 2 SPRAYS INTO EACH NOSTRIL EVERY DAY     Ear, Nose, and Throat: Nasal Preparations - Corticosteroids Passed - 11/01/2023  1:14 PM      Passed - Valid encounter within last 12 months    Recent Outpatient Visits           2 months ago Acquired hypothyroidism   [facility] [place], [place] Buzz[place], [place]

## 2023-11-18 ENCOUNTER — Ambulatory Visit: Admitting: Psychology

## 2023-11-18 DIAGNOSIS — F418 Other specified anxiety disorders: Secondary | ICD-10-CM | POA: Diagnosis not present

## 2023-11-18 DIAGNOSIS — F3289 Other specified depressive episodes: Secondary | ICD-10-CM

## 2023-11-18 NOTE — Progress Notes (Unsigned)
 [facility] Counselor/Therapist Progress Note  Patient ID: Rachael Holloway, MRN: [id number]   Date: 11/18/23  Time Spent: 3:32 pm - 4:31 pm  Treatment Type: Individual Therapy.  Reported Symptoms: Depression and anxiety.  Mental Status Exam: Appearance:  Neat and Well Groomed     Behavior: Appropriate  Motor: Normal  Speech/Language:  Clear and Coherent  Affect: Appropriate  Mood: anxious and dysthymic  Thought process: normal  Thought content:   WNL  Sensory/Perceptual disturbances:   WNL  Orientation: oriented to person, place, time/date, and situation  Attention: Good  Concentration: Good  Memory: WNL  Fund of knowledge:  Good  Insight:   Good  Judgment:  Good  Impulse Control: Good   Risk Assessment: Danger to Self:  No Self-injurious Behavior: No Danger to Others: No Duty to Warn:no Physical Aggression / Violence:No  Access to Firearms a concern: No  Gang Involvement:No   Subjective:   Rachael Holloway participated from home, via video, is aware of the limitations of tele-sessions, and consented to treatment. Therapist participated from home office.  Rachael Holloway reviewed the events of the past week.Rachael Holloway noted her cat developing a chronic health issue and noted the stressors that cause for her and her husband. She noted this requiring consistent care and noted the stressors related to this.  She noted her other being slated to move into a retirement home and Rachael Holloway's visit to help pack. She noted this process being quite emotional for her as they sorted through old family memorabilia. Additional stressors include her mother's struggle with mortality and general loss of ability as she has aged. She noted her mother being tearful prior to Rachael Holloway's flight back home and noted this being quite difficult to experience.   She noted this trip being draining and noted struggling to engage in work related tasks. She noted feeling better after sometime  and was able to reengage in work tasks. We worked on exploring this during the session. She noted pursuing naturalization and noted the process entailing various steps. We worked on processing this during the session. Therapist validated Rachael Holloway's feelings and experience, normalized her experience. Therapist encouraged Rachael Holloway to engage in self-care consistently and to proactively manage her symptoms. Rachael Holloway was engaged and motivated during the session. She expressed commitment towards goals. Rachael Holloway noted her efforts to consistently engage in self-care. Therapist provided supportive therapy.   Interventions: CBT  Diagnosis:  Other depression  Other specified anxiety disorders  Psychiatric Treatment: Yes , Via PCP.    Treatment Plan:  Client Abilities/Strengths Rachael Holloway is intelligent, well education, and motivated for change.   Support System: Family  Client Treatment Preferences Outpatient  Client Statement of Needs Rachael Holloway would like to manage symptoms, be more mindful, engage in relaxation, manage self-talk, challenge negative thoughts and feelings, and engage in self-care.   Treatment Level Weekly  Symptoms  Depression: feeling down, poor sleep, lethargy, feeling bad about self.   (Status: maintained) Anxiety: Feeling anxious, worrying about different things, trouble relaxing.    (Status: maintained)  Goals:   Kieanna experiences symptoms of depression and anxiety.  Treatment plan signed and available on s-drive:  No, pending signature.    Target Date: 12/19/23 Frequency: Weekly  Progress: 25% Modality: individual    Therapist will provide referrals for additional resources as appropriate.  Therapist will provide psycho-education regarding Ronesha's diagnosis and corresponding treatment approaches and interventions. Licensed Clinical Social Worker, [place], LCSW will support the patient's ability to achieve the goals identified. will employ  CBT, BA,  Problem-solving, Solution Focused, Mindfulness,  coping skills, & other evidenced-based practices will be used to promote progress towards healthy functioning to help manage decrease symptoms associated with her diagnosis.   Reduce overall level, frequency, and intensity of the feelings of depression, anxiety and panic evidenced by decreased overall symptoms from 6 to 7 days/week to 0 to 1 days/week per client report for at least 3 consecutive months. Verbally express understanding of the relationship between feelings of depression, anxiety and their impact on thinking patterns and behaviors. Verbalize an understanding of the role that distorted thinking plays in creating fears, excessive worry, and ruminations.    Rachael Holloway participated in the creation of the treatment plan)    Elvie Mullet, LCSW

## 2023-12-24 ENCOUNTER — Encounter: Payer: Self-pay | Admitting: Internal Medicine

## 2023-12-29 ENCOUNTER — Other Ambulatory Visit: Payer: Self-pay | Admitting: Internal Medicine

## 2024-01-01 NOTE — Telephone Encounter (Signed)
 Requested Prescriptions  Pending Prescriptions Disp Refills   levothyroxine  (SYNTHROID ) 125 MCG tablet [Pharmacy Med Name: LEVOTHYROXINE  125 MCG TABLET] 90 tablet 1    Sig: TAKE 1 TABLET BY MOUTH EVERY DAY     Endocrinology:  Hypothyroid Agents Passed - 01/01/2024  2:03 PM      Passed - TSH in normal range and within 360 days    TSH  Date Value Ref Range Status  08/05/2023 0.83 mIU/L Final    Comment:              Reference Range .           > or = 20 Years  0.40-4.50 .                Pregnancy Ranges           First trimester    0.26-2.66           Second trimester   0.55-2.73           Third trimester    0.43-2.91          Passed - Valid encounter within last 12 months    Recent Outpatient Visits           4 months ago Acquired hypothyroidism   [facility] [place], [place], [place]

## 2024-01-02 ENCOUNTER — Encounter: Payer: Self-pay | Admitting: Internal Medicine

## 2024-01-02 ENCOUNTER — Ambulatory Visit: Admitting: Internal Medicine

## 2024-01-02 VITALS — BP 124/78 | HR 74 | Ht 62.0 in | Wt 143.0 lb

## 2024-01-02 DIAGNOSIS — R002 Palpitations: Secondary | ICD-10-CM

## 2024-01-02 DIAGNOSIS — E039 Hypothyroidism, unspecified: Secondary | ICD-10-CM

## 2024-01-02 LAB — MAGNESIUM: Magnesium: 2.2 mg/dL (ref 1.5–2.5)

## 2024-01-02 LAB — POTASSIUM: Potassium: 4.7 mmol/L (ref 3.5–5.3)

## 2024-01-02 LAB — TSH: TSH: 1.23 m[IU]/L

## 2024-01-02 NOTE — Progress Notes (Signed)
 Subjective:    Patient ID: Othel FORBES Hayward, female    DOB: [birth year], 48 y.o.   MRN: [id number]  HPI  Discussed the use of AI scribe software for clinical note transcription with the patient, who gave verbal consent to proceed.  Allisen E Fetsch is a 48 year old female with hyperthyroidism who presents with palpitations.  She has been experiencing palpitations for the past two to three weeks, described as irregular and 'irregularly irregular,' with sensations of skipped beats at times. These differ from previous episodes 20 years ago due to hyperthyroidism, which were characterized by a very fast heart rate. Currently, there is no associated shortness of breath or chest pain, but she occasionally feels the palpitations.  She feels sleepy and has been taking long naps in the afternoon. There are no changes in caffeine intake, consuming no more than four cups of black tea a day, and no correlation between tea consumption and palpitations.  Her past medical history includes hypothyroidism, with normal thyroid  function tests three months ago. She recently changed her antidepressant medication from escitalopram  to venlafaxine  XR, which has improved her mood and libido. She did some research and saw that venlafaxine  can cause palpitations.      Review of Systems     Past Medical History:  Diagnosis Date   Allergy    Chicken pox    Depression    Migraines    Thyroid  disease    White coat hypertension     Current Outpatient Medications  Medication Sig Dispense Refill   atorvastatin  (LIPITOR) 10 MG tablet TAKE 1 TABLET BY MOUTH EVERY DAY 90 tablet 1   buPROPion  (WELLBUTRIN  XL) 150 MG 24 hr tablet Take 1 tablet (150 mg total) by mouth daily. 90 tablet 1   fluticasone  (FLONASE ) 50 MCG/ACT nasal spray SPRAY 2 SPRAYS INTO EACH NOSTRIL EVERY DAY 48 mL 1   ibuprofen (ADVIL,MOTRIN) 400 MG tablet Take 400 mg by mouth as needed.     levothyroxine  (SYNTHROID ) 125 MCG tablet TAKE 1 TABLET BY  MOUTH EVERY DAY 90 tablet 1   loratadine (CLARITIN) 10 MG tablet Take 10 mg by mouth daily as needed.     Multiple Vitamin (MULTIVITAMIN) capsule Take 1 capsule by mouth daily.     venlafaxine  XR (EFFEXOR -XR) 75 MG 24 hr capsule Take 1 capsule (75 mg total) by mouth daily with breakfast. 90 capsule 1   No current facility-administered medications for this visit.    Allergies  Allergen Reactions   Dust Mite Mixed Allergen Ext [Mite (D. Farinae)]    Mold Extract [Trichophyton]    Pollen Extract     Family History  Problem Relation Age of Onset   Healthy Mother    Dermatomyositis Mother    Heart disease Father    Hypertension Father    Rheum arthritis Sister    Stroke Maternal Grandmother    Heart disease Maternal Grandfather    Heart disease Paternal Grandmother    Stroke Paternal Grandfather    Breast cancer Cousin    Cancer Neg Hx    Diabetes Neg Hx     Social History   Socioeconomic History   Marital status: Married    Spouse name: Not on file   Number of children: Not on file   Years of education: Not on file   Highest education level: Doctorate  Occupational History   Not on file  Tobacco Use   Smoking status: Never   Smokeless tobacco: Never  Vaping Use  Vaping status: Never Used  Substance and Sexual Activity   Alcohol use: Yes    Alcohol/week: 5.0 standard drinks of alcohol    Types: 5 Glasses of wine per week    Comment: moderate,none last 24hrs   Drug use: No   Sexual activity: Yes  Other Topics Concern   Not on file  Social History Narrative   Not on file   Social Drivers of Health   Financial Resource Strain: Low Risk  (12/30/2023)   Overall Financial Resource Strain (CARDIA)    Difficulty of Paying Living Expenses: Not hard at all  Food Insecurity: No Food Insecurity (12/30/2023)   Hunger Vital Sign    Worried About Running Out of Food in the Last Year: Never true    Ran Out of Food in the Last Year: Never true  Transportation Needs: No  Transportation Needs (12/30/2023)   PRAPARE - Administrator, Civil Service (Medical): No    Lack of Transportation (Non-Medical): No  Physical Activity: Sufficiently Active (12/30/2023)   Exercise Vital Sign    Days of Exercise per Week: 6 days    Minutes of Exercise per Session: 40 min  Stress: No Stress Concern Present (12/30/2023)   [employer] of Occupational Health - Occupational Stress Questionnaire    Feeling of Stress: Not at all  Social Connections: Moderately Isolated (12/30/2023)   Social Connection and Isolation Panel    Frequency of Communication with Friends and Family: Once a week    Frequency of Social Gatherings with Friends and Family: Once a week    Attends Religious Services: Never    Database administrator or Organizations: Yes    Attends Banker Meetings: 1 to 4 times per year    Marital Status: Married  Catering manager Violence: Not on file     Constitutional: Patient reports intermittent headaches. Denies fever, malaise, fatigue, or abrupt weight changes.  Respiratory: Denies difficulty breathing, shortness of breath, cough or sputum production.   Cardiovascular: Pt reports intermittent palpitations. Denies chest pain, chest tightness, or swelling in the hands or feet.  Gastrointestinal: Denies abdominal pain, bloating, constipation, diarrhea or blood in the stool.  GU: Patient reports decreased libido, irregular menses.  Denies urgency, frequency, pain with urination, burning sensation, blood in urine, odor or discharge. Musculoskeletal: Denies decrease in range of motion, difficulty with gait, muscle pain or joint pain and swelling.  Skin: Denies redness, rashes, lesions or ulcercations.  Neurological: Pt reports insomnia. Denies dizziness, difficulty with memory, difficulty with speech or problems with balance and coordination.  Psych: Patient has a history of anxiety and depression.  Denies  SI/HI.  No other specific complaints  in a complete review of systems (except as listed in HPI above).  Objective:   Physical Exam  BP 124/78 (BP Location: Left Arm, Patient Position: Sitting, Cuff Size: Normal)   Pulse 74   Ht 5' 2 (1.575 m)   Wt 143 lb (64.9 kg)   LMP 12/24/2023 (Exact Date)   SpO2 98%   BMI 26.16 kg/m     Wt Readings from Last 3 Encounters:  08/05/23 147 lb 3.2 oz (66.8 kg)  02/15/23 146 lb (66.2 kg)  01/14/23 140 lb (63.5 kg)    General: Appears her stated age, overweight, in NAD. Neck:  Neck supple, trachea midline. .  Cardiovascular: Normal rate and rhythm with occasional skipped beat noted. S1,S2 noted.  No murmur, rubs or gallops noted.  Pulmonary/Chest: Normal effort and positive vesicular  breath sounds. No respiratory distress. No wheezes, rales or ronchi noted.  Musculoskeletal: No difficulty with gait.  Neurological: Alert and oriented. Psychiatric: Mood and affect normal. Behavior is normal. Judgment and thought content normal.    BMET    Component Value Date/Time   NA 139 08/05/2023 1009   NA 139 10/13/2020 0807   K 4.2 08/05/2023 1009   CL 104 08/05/2023 1009   CO2 28 08/05/2023 1009   GLUCOSE 93 08/05/2023 1009   BUN 8 08/05/2023 1009   BUN 8 10/13/2020 0807   CREATININE 0.88 08/05/2023 1009   CALCIUM  9.3 08/05/2023 1009   GFRNONAA 86 11/06/2018 1433   GFRAA 99 11/06/2018 1433    Lipid Panel     Component Value Date/Time   CHOL 176 08/05/2023 1009   CHOL 216 (H) 10/13/2020 0807   TRIG 191 (H) 08/05/2023 1009   HDL 59 08/05/2023 1009   HDL 56 10/13/2020 0807   CHOLHDL 3.0 08/05/2023 1009   VLDL 34.6 10/08/2019 1023   LDLCALC 88 08/05/2023 1009    CBC    Component Value Date/Time   WBC 4.9 08/05/2023 1009   RBC 4.29 08/05/2023 1009   HGB 11.5 (L) 08/05/2023 1009   HGB 14.1 10/13/2020 0807   HCT 36.6 08/05/2023 1009   HCT 42.6 10/13/2020 0807   PLT 327 08/05/2023 1009   PLT 280 10/13/2020 0807   MCV 85.3 08/05/2023 1009   MCV 95 10/13/2020 0807   MCH  26.8 (L) 08/05/2023 1009   MCHC 31.4 (L) 08/05/2023 1009   RDW 13.5 08/05/2023 1009   RDW 11.8 10/13/2020 0807   LYMPHSABS 2.2 10/13/2020 0807   EOSABS 0.2 10/13/2020 0807   BASOSABS 0.1 10/13/2020 0807    Hgb A1C Lab Results  Component Value Date   HGBA1C 5.4 08/05/2023           Assessment & Plan:  Assessment and Plan    Palpitations Palpitations for 2-3 weeks, irregularly irregular. EKG normal. Possible causes: hypothyroidism, electrolyte imbalance, venlafaxine  XR side effects. - Order TSH, magnesium, and potassium levels. - Consider Holter monitor for 3 days to 2 weeks if labs are normal. - Discuss potential switch from venlafaxine  XR if labs and Holter monitor are normal.   RTC in 1 months for your annual exam [place], NP

## 2024-01-02 NOTE — Patient Instructions (Signed)
 Palpitations Palpitations are feelings that your heartbeat is not normal. Your heartbeat may feel like it is: Uneven (irregular). Faster than normal. Fluttering. Skipping a beat. This is usually not a serious problem. However, a doctor will do tests and check your medical history to make sure that you do not have a serious heart problem. Follow these instructions at home: Watch for any changes in your condition. Tell your doctor about any changes. Take these actions to help manage your symptoms: Eating and drinking Follow instructions from your doctor about things to eat and drink. You may be told to avoid these things: Drinks that have caffeine in them, such as coffee, tea, soft drinks, and energy drinks. Chocolate. Alcohol. Diet pills. Lifestyle     Try to lower your stress. These things can help you relax: Yoga. Deep breathing and meditation. Guided imagery. This is using words and images to create positive thoughts. Exercise, including swimming, jogging, and walking. Tell your doctor if you have more abnormal heartbeats when you are active. If you have chest pain or feel short of breath with exercise, do not keep doing the exercise until you are seen by your doctor. Biofeedback. This is using your mind to control things in your body, such as your heartbeat. Get plenty of rest and sleep. Keep a regular bed time. Do not use drugs, such as cocaine or ecstasy. Do not use marijuana. Do not smoke or use any products that contain nicotine or tobacco. If you need help quitting, ask your doctor. General instructions Take over-the-counter and prescription medicines only as told by your doctor. Keep all follow-up visits. You may need more tests if palpitations do not go away or get worse. Contact a doctor if: You keep having fast or uneven heartbeats for a long time. Your symptoms happen more often. Get help right away if: You have chest pain. You feel short of breath. You have a very  bad headache. You feel dizzy. You faint. These symptoms may be an emergency. Get help right away. Call your local emergency services (911 in the [place] Do not wait to see if the symptoms will go away. Do not drive yourself to the hospital. Summary Palpitations are feelings that your heartbeat is uneven or faster than normal. It may feel like your heart is fluttering or skipping a beat. Avoid food and drinks that may cause this condition. These include caffeine, chocolate, and alcohol. Try to lower your stress. Do not smoke or use drugs. Get help right away if you faint, feel dizzy, feel short of breath, have chest pain, or have a very bad headache. This information is not intended to replace advice given to you by your health care provider. Make sure you discuss any questions you have with your health care provider. Document Revised: 09/28/2020 Document Reviewed: 09/28/2020 Elsevier Patient Education  2024 [employer].

## 2024-01-03 ENCOUNTER — Ambulatory Visit: Payer: Self-pay | Admitting: Internal Medicine

## 2024-01-03 ENCOUNTER — Ambulatory Visit: Attending: Internal Medicine

## 2024-01-03 DIAGNOSIS — R002 Palpitations: Secondary | ICD-10-CM

## 2024-01-16 ENCOUNTER — Other Ambulatory Visit: Payer: Self-pay | Admitting: Internal Medicine

## 2024-01-17 NOTE — Telephone Encounter (Signed)
 Requested by interface surescripts. Future visit 02/17/24.  Requested Prescriptions  Pending Prescriptions Disp Refills   venlafaxine  XR (EFFEXOR -XR) 75 MG 24 hr capsule [Pharmacy Med Name: VENLAFAXINE  HCL ER 75 MG CAP] 90 capsule 0    Sig: TAKE 1 CAPSULE BY MOUTH DAILY WITH BREAKFAST.     Psychiatry: Antidepressants - SNRI - desvenlafaxine & venlafaxine  Failed - 01/17/2024  2:51 PM      Failed - Lipid Panel in normal range within the last 12 months    Cholesterol, Total  Date Value Ref Range Status  10/13/2020 216 (H) 100 - 199 mg/dL Final   Cholesterol  Date Value Ref Range Status  08/05/2023 176 <200 mg/dL Final   LDL Cholesterol (Calc)  Date Value Ref Range Status  08/05/2023 88 mg/dL (calc) Final    Comment:    Reference range: <100 . Desirable range <100 mg/dL for primary prevention;   <70 mg/dL for patients with CHD or diabetic patients  with > or = 2 CHD risk factors. SABRA LDL-C is now calculated using the Martin-Hopkins  calculation, which is a validated novel method providing  better accuracy than the Friedewald equation in the  estimation of LDL-C.  Gladis APPLETHWAITE et al. SANDREA. 7986;689(80): 2061-2068  (http://education.[employer].com/faq/FAQ164)    Direct LDL  Date Value Ref Range Status  09/09/2019 150.0 mg/dL Final    Comment:    Optimal:  <100 mg/dLNear or Above Optimal:  100-129 mg/dLBorderline High:  130-159 mg/dLHigh:  160-189 mg/dLVery High:  >190 mg/dL   HDL  Date Value Ref Range Status  08/05/2023 59 > OR = 50 mg/dL Final  94/73/7977 56 >60 mg/dL Final   Triglycerides  Date Value Ref Range Status  08/05/2023 191 (H) <150 mg/dL Final         Passed - Cr in normal range and within 360 days    Creat  Date Value Ref Range Status  08/05/2023 0.88 0.50 - 0.99 mg/dL Final         Passed - Completed PHQ-2 or PHQ-9 in the last 360 days      Passed - Last BP in normal range    BP Readings from Last 1 Encounters:  01/02/24 124/78         Passed -  Valid encounter within last 6 months    Recent Outpatient Visits           2 weeks ago Palpitations   [place] [facility] [place], Angeline ORN, NP   5 months ago Acquired hypothyroidism    [facility] [place], Angeline ORN, NP

## 2024-01-23 DIAGNOSIS — R002 Palpitations: Secondary | ICD-10-CM | POA: Diagnosis not present

## 2024-01-25 DIAGNOSIS — R002 Palpitations: Secondary | ICD-10-CM

## 2024-01-27 ENCOUNTER — Ambulatory Visit: Payer: Self-pay | Admitting: Internal Medicine

## 2024-01-29 ENCOUNTER — Encounter: Payer: Self-pay | Admitting: Internal Medicine

## 2024-01-29 ENCOUNTER — Ambulatory Visit: Admitting: Internal Medicine

## 2024-01-29 VITALS — BP 124/70 | HR 84 | Ht 62.0 in | Wt 141.8 lb

## 2024-01-29 DIAGNOSIS — G4701 Insomnia due to medical condition: Secondary | ICD-10-CM

## 2024-01-29 DIAGNOSIS — R5383 Other fatigue: Secondary | ICD-10-CM | POA: Diagnosis not present

## 2024-01-29 DIAGNOSIS — R002 Palpitations: Secondary | ICD-10-CM | POA: Diagnosis not present

## 2024-01-29 MED ORDER — PROPRANOLOL HCL 10 MG PO TABS: 10.0000 mg | ORAL_TABLET | Freq: Three times a day (TID) | ORAL | 0 refills | Status: DC | PRN

## 2024-01-29 NOTE — Patient Instructions (Signed)
 Premature Ventricular Contractions In this video, you'll learn about causes of premature ventricular contractions or PVCs, common symptoms to watch for, and when you should seek medical care. To view the content, go to this web address: [email or web address]  This video will expire on: 05/01/2025. If you need access to this video following this date, please reach out to the healthcare provider who assigned it to you. This information is not intended to replace advice given to you by your health care provider. Make sure you discuss any questions you have with your health care provider. Elsevier Patient Education  2024 [employer].

## 2024-01-29 NOTE — Progress Notes (Signed)
 Subjective:    Patient ID: Rachael Holloway, female    DOB: 09/27/1975, 48 y.o.   MRN: [id number]  HPI  Discussed the use of AI scribe software for clinical note transcription with the patient, who gave verbal consent to proceed.  Rachael Holloway is a 48 year old female who presents for follow-up regarding palpitations and fatigue.  She experiences palpitations with heart rates ranging from 57 to 218 beats per minute, averaging 89 beats per minute. A heart monitor worn for seven days recorded 510 extra beats, with a brief period of elevated heart rate lasting ten seconds. She is concerned about the potential side effects of her current medication, venlafaxine , which she takes at a dose of 75 mg daily.  Fatigue has been present since July, which she attributes partly to a change in schedule and a recent trip to [place] to assist her mother with moving to assisted living. She feels physically exhausted and frequently lies on the couch. Her sleep pattern includes going to bed early at 7:30 PM and waking up at 6:00 AM, but she wakes up multiple times during the night, sometimes feeling sweaty or with a fast heartbeat. She does feel rested upon waking and has been napping during the day before school started in late August.  She questions whether her symptoms could be related to menopause or estrogen levels, noting that she sometimes wakes up sweaty. Occasional snoring, particularly when congested, raises the possibility of sleep apnea. No excessive caffeine consumption. She has a history of a coronary calcium  score of zero from six months ago.        Review of Systems     Past Medical History:  Diagnosis Date   Allergy    Anxiety    Chicken pox    Depression    Hyperlipidemia    Migraines    Thyroid  disease    White coat hypertension     Current Outpatient Medications  Medication Sig Dispense Refill   atorvastatin  (LIPITOR) 10 MG tablet TAKE 1 TABLET BY MOUTH EVERY DAY 90 tablet 1    buPROPion  (WELLBUTRIN  XL) 150 MG 24 hr tablet Take 1 tablet (150 mg total) by mouth daily. 90 tablet 1   fexofenadine (ALLEGRA ALLERGY) 180 MG tablet Take 180 mg by mouth as needed.     fluticasone  (FLONASE ) 50 MCG/ACT nasal spray SPRAY 2 SPRAYS INTO EACH NOSTRIL EVERY DAY 48 mL 1   ibuprofen (ADVIL,MOTRIN) 400 MG tablet Take 400 mg by mouth as needed.     levothyroxine  (SYNTHROID ) 125 MCG tablet TAKE 1 TABLET BY MOUTH EVERY DAY 90 tablet 1   loratadine (CLARITIN) 10 MG tablet Take 10 mg by mouth daily as needed.     Multiple Vitamin (MULTIVITAMIN) capsule Take 1 capsule by mouth daily.     venlafaxine  XR (EFFEXOR -XR) 75 MG 24 hr capsule TAKE 1 CAPSULE BY MOUTH DAILY WITH BREAKFAST. 90 capsule 0   No current facility-administered medications for this visit.    Allergies  Allergen Reactions   Dust Mite Mixed Allergen Ext [Mite (D. Farinae)]    Mold Extract [Trichophyton]    Pollen Extract     Family History  Problem Relation Age of Onset   Dermatomyositis Mother    Hearing loss Mother    Varicose Veins Mother    Heart disease Father    Hypertension Father    Rheum arthritis Sister    Hearing loss Brother    Stroke Maternal Grandmother    Heart disease  Maternal Grandfather    Heart disease Paternal Grandmother    Stroke Paternal Grandfather    Breast cancer Cousin    Hearing loss Sister    Cancer Neg Hx    Diabetes Neg Hx     Social History   Socioeconomic History   Marital status: Married    Spouse name: Not on file   Number of children: Not on file   Years of education: Not on file   Highest education level: Doctorate  Occupational History   Not on file  Tobacco Use   Smoking status: Never   Smokeless tobacco: Never  Vaping Use   Vaping status: Never Used  Substance and Sexual Activity   Alcohol use: Yes    Alcohol/week: 5.0 standard drinks of alcohol    Types: 5 Glasses of wine per week    Comment: moderate,none last 24hrs   Drug use: No   Sexual  activity: Yes  Other Topics Concern   Not on file  Social History Narrative   Not on file   Social Drivers of Health   Financial Resource Strain: Low Risk  (12/30/2023)   Overall Financial Resource Strain (CARDIA)    Difficulty of Paying Living Expenses: Not hard at all  Food Insecurity: No Food Insecurity (12/30/2023)   Hunger Vital Sign    Worried About Running Out of Food in the Last Year: Never true    Ran Out of Food in the Last Year: Never true  Transportation Needs: No Transportation Needs (12/30/2023)   PRAPARE - Administrator, Civil Service (Medical): No    Lack of Transportation (Non-Medical): No  Physical Activity: Sufficiently Active (12/30/2023)   Exercise Vital Sign    Days of Exercise per Week: 6 days    Minutes of Exercise per Session: 40 min  Stress: No Stress Concern Present (12/30/2023)   [employer] of Occupational Health - Occupational Stress Questionnaire    Feeling of Stress: Not at all  Social Connections: Moderately Isolated (12/30/2023)   Social Connection and Isolation Panel    Frequency of Communication with Friends and Family: Once a week    Frequency of Social Gatherings with Friends and Family: Once a week    Attends Religious Services: Never    Database administrator or Organizations: Yes    Attends Banker Meetings: 1 to 4 times per year    Marital Status: Married  Catering manager Violence: Not on file     Constitutional: Patient reports intermittent headaches, fatigued. Denies fever, malaise, or abrupt weight changes.  Respiratory: Denies difficulty breathing, shortness of breath, cough or sputum production.   Cardiovascular: Pt reports intermittent palpitations. Denies chest pain, chest tightness, or swelling in the hands or feet.  Gastrointestinal: Denies abdominal pain, bloating, constipation, diarrhea or blood in the stool.  GU: Patient reports decreased libido, irregular menses.  Denies urgency, frequency, pain  with urination, burning sensation, blood in urine, odor or discharge. Musculoskeletal: Denies decrease in range of motion, difficulty with gait, muscle pain or joint pain and swelling.  Skin: Denies redness, rashes, lesions or ulcercations.  Neurological: Pt reports insomnia. Denies dizziness, difficulty with memory, difficulty with speech or problems with balance and coordination.  Psych: Patient has a history of anxiety and depression.  Denies  SI/HI.  No other specific complaints in a complete review of systems (except as listed in HPI above).  Objective:   Physical Exam BP 124/70 (BP Location: Left Arm, Patient Position: Sitting, Cuff Size:  Normal)   Pulse 84   Ht 5' 2 (1.575 m)   Wt 141 lb 12.8 oz (64.3 kg)   LMP 12/24/2023 (Exact Date)   SpO2 98%   BMI 25.94 kg/m      Wt Readings from Last 3 Encounters:  01/02/24 143 lb (64.9 kg)  08/05/23 147 lb 3.2 oz (66.8 kg)  02/15/23 146 lb (66.2 kg)    General: Appears her stated age, overweight, in NAD. Cardiovascular: Normal rate. Pulmonary/Chest: Normal effort and positive vesicular breath sounds. No respiratory distress. No wheezes, rales or ronchi noted.  Musculoskeletal: No difficulty with gait.  Neurological: Alert and oriented.   BMET    Component Value Date/Time   NA 139 08/05/2023 1009   NA 139 10/13/2020 0807   K 4.7 01/02/2024 1022   CL 104 08/05/2023 1009   CO2 28 08/05/2023 1009   GLUCOSE 93 08/05/2023 1009   BUN 8 08/05/2023 1009   BUN 8 10/13/2020 0807   CREATININE 0.88 08/05/2023 1009   CALCIUM  9.3 08/05/2023 1009   GFRNONAA 86 11/06/2018 1433   GFRAA 99 11/06/2018 1433    Lipid Panel     Component Value Date/Time   CHOL 176 08/05/2023 1009   CHOL 216 (H) 10/13/2020 0807   TRIG 191 (H) 08/05/2023 1009   HDL 59 08/05/2023 1009   HDL 56 10/13/2020 0807   CHOLHDL 3.0 08/05/2023 1009   VLDL 34.6 10/08/2019 1023   LDLCALC 88 08/05/2023 1009    CBC    Component Value Date/Time   WBC 4.9  08/05/2023 1009   RBC 4.29 08/05/2023 1009   HGB 11.5 (L) 08/05/2023 1009   HGB 14.1 10/13/2020 0807   HCT 36.6 08/05/2023 1009   HCT 42.6 10/13/2020 0807   PLT 327 08/05/2023 1009   PLT 280 10/13/2020 0807   MCV 85.3 08/05/2023 1009   MCV 95 10/13/2020 0807   MCH 26.8 (L) 08/05/2023 1009   MCHC 31.4 (L) 08/05/2023 1009   RDW 13.5 08/05/2023 1009   RDW 11.8 10/13/2020 0807   LYMPHSABS 2.2 10/13/2020 0807   EOSABS 0.2 10/13/2020 0807   BASOSABS 0.1 10/13/2020 0807    Hgb A1C Lab Results  Component Value Date   HGBA1C 5.4 08/05/2023           Assessment & Plan:   Assessment and Plan    Palpitations with frequent premature ventricular contractions Heart monitor showed multiple PVCs, no arrhythmias or atrial fibrillation. Venlafaxine  unlikely cause. Palpitations not linked to increased heart risk. Caffeine reduction advised. Sleep apnea considered. - Prescribe propranolol  10 mg as needed for palpitations, up to every 8 hours. - Advise minimizing caffeine intake. - Consider home sleep test to evaluate for sleep apnea.  Insomnia with frequent nocturnal awakenings Frequent awakenings with sweating or fast heartbeat, difficulty returning to sleep. Possible link to palpitations or sleep apnea. - Consider home sleep test to evaluate for sleep apnea.  Fatigue Fatigue present for two months, possibly due to stress and schedule changes. Beta blockers may exacerbate fatigue. - Monitor fatigue levels with as-needed propranolol  use.        RTC in 2 week for your annual exam Angeline Laura, NP

## 2024-02-17 ENCOUNTER — Encounter: Admitting: Internal Medicine

## 2024-02-19 ENCOUNTER — Ambulatory Visit: Admitting: Psychology

## 2024-02-19 DIAGNOSIS — F3289 Other specified depressive episodes: Secondary | ICD-10-CM | POA: Diagnosis not present

## 2024-02-19 DIAGNOSIS — F418 Other specified anxiety disorders: Secondary | ICD-10-CM | POA: Diagnosis not present

## 2024-02-19 NOTE — Progress Notes (Signed)
 [facility] Counselor/Therapist Progress Note  Patient ID: Rachael Holloway, MRN: [id number]   Date: 02/19/24  Time Spent: 9:03 am - 9:59 am : 56 Minutes  Treatment Type: Individual Therapy.  Reported Symptoms: Depression and anxiety.  Mental Status Exam: Appearance:  Neat and Well Groomed     Behavior: Appropriate  Motor: Normal  Speech/Language:  Clear and Coherent  Affect: Appropriate  Mood: anxious  Thought process: normal  Thought content:   WNL  Sensory/Perceptual disturbances:   WNL  Orientation: oriented to person, place, time/date, and situation  Attention: Good  Concentration: Good  Memory: WNL  Fund of knowledge:  Good  Insight:   Good  Judgment:  Good  Impulse Control: Good   Risk Assessment: Danger to Self:  No Self-injurious Behavior: No Danger to Others: No Duty to Warn:no Physical Aggression / Violence:No  Access to Firearms a concern: No  Gang Involvement:No   Subjective:   Rachael Holloway participated from home, via video, is aware of the limitations of tele-sessions, and consented to treatment. Therapist participated from home office.  Rachael Holloway reviewed the events of the past week. She noted her anxiety rising in the past month and noted various contributing factors to her anxiety including her mothers transition into a retirement home, work related stressors (colleague's recurrent of pancreatic cancer), and feeling overwhelmed by her workload at work. We worked on exploring these stressors during the session. Rachael Holloway noted her mother's difficult transition to a retirement home and noted maintaining contact with her mother and providing support. She noted the distance being difficult to manage and seeing her mother being sad, also being difficult. Rachael Holloway noted not have much control in this area as she attempts to support her mother. She acknowledged that this is an adjustment period for her mother having moved in ~2 weeks ago. She noted her  colleague's cancer diagnosis being difficult to process and noted this being difficult to traverse emotionally. She noted being overwhelmed at work but the sheer amount of work and noted difficulty managing the stress. 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 She noted her frustration with her husband's lack of follow through regarding putting tools or belongings away and cleaning after himself. She noted her efforts to communicate her need for more organization and cleanliness. Rachael Holloway noted difficulty with reminding her husband and noted feeling like she is nagging him. We worked on reviewing her attempts to communicate her needs. Therapist reviewed communication skills, assertiveness, and boundary setting. Rachael Holloway was engaged and receptive during the session. She expressed commitment towards goals. Therapist praised Rachael Holloway for her effort during the session and provided supportive therapy. A follow-up was scheduled for continued treatment, which she benefits from.   Interventions: CBT  Diagnosis:  Other depression  Other specified anxiety disorders  Psychiatric Treatment: Yes , Via PCP.    Treatment Plan:  Client Abilities/Strengths Rachael Holloway is intelligent, well education, and motivated for change.   Support System: Family  Client Treatment Preferences Outpatient  Client Statement of Needs Rachael Holloway would like to manage symptoms, be more mindful, engage in relaxation, manage self-talk, challenge negative thoughts and feelings, and engage in self-care.   Treatment Level Weekly  Symptoms  Depression: feeling down, poor sleep, lethargy, feeling bad about self.   (Status: maintained) Anxiety: Feeling anxious, worrying about different things, trouble relaxing.    (Status: maintained)  Goals:   Rachael Holloway experiences symptoms of depression and anxiety.  Treatment plan signed and available on s-drive:   No, pending signature.    Target Date: 02/19/24 Frequency: Weekly  Progress: 25% Modality: individual    Therapist will provide referrals for additional resources as appropriate.  Therapist will provide psycho-education regarding Rachael Holloway's diagnosis and corresponding treatment approaches and interventions. Licensed Clinical Social Worker, [place], LCSW will support the patient's ability to achieve the goals identified. will employ CBT, BA, Problem-solving, Solution Focused, Mindfulness,  coping skills, & other evidenced-based practices will be used to promote progress towards healthy functioning to help manage decrease symptoms associated with her diagnosis.   Reduce overall level, frequency, and intensity of the feelings of depression, anxiety and panic evidenced by decreased overall symptoms from 6 to 7 days/week to 0 to 1 days/week per client report for at least 3 consecutive months. Verbally express understanding of the relationship between feelings of depression, anxiety and their impact on thinking patterns and behaviors. Verbalize an understanding of the role that distorted thinking plays in creating fears, excessive worry, and ruminations.    Ulla participated in the creation of the treatment plan)    Rachael Mullet, LCSW

## 2024-02-21 ENCOUNTER — Other Ambulatory Visit: Payer: Self-pay | Admitting: Internal Medicine

## 2024-02-24 NOTE — Telephone Encounter (Signed)
 Requested Prescriptions  Pending Prescriptions Disp Refills   propranolol  (INDERAL ) 10 MG tablet [Pharmacy Med Name: PROPRANOLOL  10 MG TABLET] 270 tablet 0    Sig: TAKE 1 TABLET BY MOUTH THREE TIMES A DAY AS NEEDED     Cardiovascular:  Beta Blockers Passed - 02/24/2024 10:46 AM      Passed - Last BP in normal range    BP Readings from Last 1 Encounters:  01/29/24 124/70         Passed - Last Heart Rate in normal range    Pulse Readings from Last 1 Encounters:  01/29/24 84         Passed - Valid encounter within last 6 months    Recent Outpatient Visits           3 weeks ago Palpitations   [place] [facility] [place], Angeline ORN, NP   1 month ago Palpitations   [place] [place], Angeline ORN, NP   6 months ago Acquired hypothyroidism   [place] [facility] [place], Angeline ORN, [place]

## 2024-02-28 ENCOUNTER — Encounter: Payer: Self-pay | Admitting: Internal Medicine

## 2024-02-28 ENCOUNTER — Ambulatory Visit (INDEPENDENT_AMBULATORY_CARE_PROVIDER_SITE_OTHER): Admitting: Internal Medicine

## 2024-02-28 VITALS — BP 124/78 | Ht 62.0 in | Wt 142.0 lb

## 2024-02-28 DIAGNOSIS — Z23 Encounter for immunization: Secondary | ICD-10-CM | POA: Diagnosis not present

## 2024-02-28 DIAGNOSIS — Z0001 Encounter for general adult medical examination with abnormal findings: Secondary | ICD-10-CM | POA: Diagnosis not present

## 2024-02-28 DIAGNOSIS — R739 Hyperglycemia, unspecified: Secondary | ICD-10-CM | POA: Diagnosis not present

## 2024-02-28 DIAGNOSIS — E782 Mixed hyperlipidemia: Secondary | ICD-10-CM | POA: Diagnosis not present

## 2024-02-28 DIAGNOSIS — E039 Hypothyroidism, unspecified: Secondary | ICD-10-CM

## 2024-02-28 DIAGNOSIS — Z1231 Encounter for screening mammogram for malignant neoplasm of breast: Secondary | ICD-10-CM

## 2024-02-28 NOTE — Progress Notes (Signed)
 Subjective:    Patient ID: Rachael Holloway, female    DOB: May 30, 1975, 48 y.o.   MRN: [id number]  HPI  Patient presents to clinic today for her annual exam.  Flu: 01/2023 Tetanus: 09/2020 COVID: X 6 Shingrix: Never Pap smear: 08/2019 Mammogram: 06/2023 Colon screening: 11/2022 Vision screening: annually Dentist: biannually  Diet: She does eat meat. She consumes fruits and veggies. She tries to avoid fried foods. She drinks mostly water, hot tea, coffee. Exercise: Walking, 4 x week  Review of Systems     Past Medical History:  Diagnosis Date   Allergy    Anxiety    Chicken pox    Depression    Hyperlipidemia    Migraines    Thyroid  disease    White coat hypertension     Current Outpatient Medications  Medication Sig Dispense Refill   atorvastatin  (LIPITOR) 10 MG tablet TAKE 1 TABLET BY MOUTH EVERY DAY 90 tablet 1   buPROPion  (WELLBUTRIN  XL) 150 MG 24 hr tablet Take 1 tablet (150 mg total) by mouth daily. 90 tablet 1   fexofenadine (ALLEGRA ALLERGY) 180 MG tablet Take 180 mg by mouth as needed.     fluticasone  (FLONASE ) 50 MCG/ACT nasal spray SPRAY 2 SPRAYS INTO EACH NOSTRIL EVERY DAY 48 mL 1   ibuprofen (ADVIL,MOTRIN) 400 MG tablet Take 400 mg by mouth as needed.     levothyroxine  (SYNTHROID ) 125 MCG tablet TAKE 1 TABLET BY MOUTH EVERY DAY 90 tablet 1   loratadine (CLARITIN) 10 MG tablet Take 10 mg by mouth daily as needed.     Multiple Vitamin (MULTIVITAMIN) capsule Take 1 capsule by mouth daily.     propranolol  (INDERAL ) 10 MG tablet TAKE 1 TABLET BY MOUTH THREE TIMES A DAY AS NEEDED 270 tablet 0   venlafaxine  XR (EFFEXOR -XR) 75 MG 24 hr capsule TAKE 1 CAPSULE BY MOUTH DAILY WITH BREAKFAST. 90 capsule 0   No current facility-administered medications for this visit.    Allergies  Allergen Reactions   Dust Mite Mixed Allergen Ext [Mite (D. Farinae)]    Mold Extract [Trichophyton]    Pollen Extract     Family History  Problem Relation Age of Onset    Dermatomyositis Mother    Hearing loss Mother    Varicose Veins Mother    Heart disease Father    Hypertension Father    Rheum arthritis Sister    Hearing loss Brother    Stroke Maternal Grandmother    Heart disease Maternal Grandfather    Heart disease Paternal Grandmother    Stroke Paternal Grandfather    Breast cancer Cousin    Hearing loss Sister    Cancer Neg Hx    Diabetes Neg Hx     Social History   Socioeconomic History   Marital status: Married    Spouse name: Not on file   Number of children: Not on file   Years of education: Not on file   Highest education level: Doctorate  Occupational History   Not on file  Tobacco Use   Smoking status: Never   Smokeless tobacco: Never  Vaping Use   Vaping status: Never Used  Substance and Sexual Activity   Alcohol use: Yes    Alcohol/week: 5.0 standard drinks of alcohol    Types: 5 Glasses of wine per week    Comment: moderate,none last 24hrs   Drug use: No   Sexual activity: Yes  Other Topics Concern   Not on file  Social History  Narrative   Not on file   Social Drivers of Health   Financial Resource Strain: Low Risk  (12/30/2023)   Overall Financial Resource Strain (CARDIA)    Difficulty of Paying Living Expenses: Not hard at all  Food Insecurity: No Food Insecurity (12/30/2023)   Hunger Vital Sign    Worried About Running Out of Food in the Last Year: Never true    Ran Out of Food in the Last Year: Never true  Transportation Needs: No Transportation Needs (12/30/2023)   PRAPARE - Administrator, Civil Service (Medical): No    Lack of Transportation (Non-Medical): No  Physical Activity: Sufficiently Active (12/30/2023)   Exercise Vital Sign    Days of Exercise per Week: 6 days    Minutes of Exercise per Session: 40 min  Stress: No Stress Concern Present (12/30/2023)   [employer] of Occupational Health - Occupational Stress Questionnaire    Feeling of Stress: Not at all  Social Connections:  Moderately Isolated (12/30/2023)   Social Connection and Isolation Panel    Frequency of Communication with Friends and Family: Once a week    Frequency of Social Gatherings with Friends and Family: Once a week    Attends Religious Services: Never    Database administrator or Organizations: Yes    Attends Banker Meetings: 1 to 4 times per year    Marital Status: Married  Catering manager Violence: Not on file     Constitutional: Patient reports intermittent headaches.  Denies fever, malaise, fatigue, or abrupt weight changes.  HEENT: Denies eye pain, eye redness, ear pain, ringing in the ears, wax buildup, runny nose, nasal congestion, bloody nose, or sore throat. Respiratory: Denies difficulty breathing, shortness of breath, cough or sputum production.   Cardiovascular: Patient reports intermittent palpitations.  Denies chest pain, chest tightness, or swelling in the hands or feet.  Gastrointestinal: Denies abdominal pain, bloating, constipation, diarrhea or blood in the stool.  GU: Patient reports irregular periods.  Denies urgency, frequency, pain with urination, burning sensation, blood in urine, odor or discharge. Musculoskeletal: Denies decrease in range of motion, difficulty with gait, muscle pain or joint pain and swelling.  Skin: Denies redness, rashes, lesions or ulcercations.  Neurological: Denies dizziness, difficulty with memory, difficulty with speech or problems with balance and coordination.  Psych: Patient has a history of anxiety and depression.  Denies SI/HI.  No other specific complaints in a complete review of systems (except as listed in HPI above).  Objective:  BP 124/78 (BP Location: Left Arm, Patient Position: Sitting, Cuff Size: Normal)   Ht 5' 2 (1.575 m)   Wt 142 lb (64.4 kg)   LMP 02/04/2024 (Exact Date)   BMI 25.97 kg/m    Wt Readings from Last 3 Encounters:  01/29/24 141 lb 12.8 oz (64.3 kg)  01/02/24 143 lb (64.9 kg)  08/05/23 147 lb  3.2 oz (66.8 kg)    General: Appears her stated age, overweight, in NAD. Skin: Warm, dry and intact. No rashes, lesions or ulcerations noted. HEENT: Head: normal shape and size; Eyes: sclera white, no icterus, conjunctiva pink, PERRLA and EOMs intact;  Cardiovascular: Normal rate and rhythm. S1,S2 noted with occasional extra beat.  No murmur, rubs or gallops noted. No JVD or BLE edema.  Pulmonary/Chest: Normal effort and positive vesicular breath sounds. No respiratory distress. No wheezes, rales or ronchi noted.  Abdomen: Soft and nontender. Normal bowel sounds.  Musculoskeletal: Strength 5/5 BUE/BLE. No difficulty with gait.  Neurological: Alert and oriented. Cranial nerves II-XII grossly intact. Coordination normal.  Psychiatric: Mood and affect normal. Behavior is normal. Judgment and thought content normal.    BMET    Component Value Date/Time   NA 139 08/05/2023 1009   NA 139 10/13/2020 0807   K 4.7 01/02/2024 1022   CL 104 08/05/2023 1009   CO2 28 08/05/2023 1009   GLUCOSE 93 08/05/2023 1009   BUN 8 08/05/2023 1009   BUN 8 10/13/2020 0807   CREATININE 0.88 08/05/2023 1009   CALCIUM  9.3 08/05/2023 1009   GFRNONAA 86 11/06/2018 1433   GFRAA 99 11/06/2018 1433    Lipid Panel     Component Value Date/Time   CHOL 176 08/05/2023 1009   CHOL 216 (H) 10/13/2020 0807   TRIG 191 (H) 08/05/2023 1009   HDL 59 08/05/2023 1009   HDL 56 10/13/2020 0807   CHOLHDL 3.0 08/05/2023 1009   VLDL 34.6 10/08/2019 1023   LDLCALC 88 08/05/2023 1009    CBC    Component Value Date/Time   WBC 4.9 08/05/2023 1009   RBC 4.29 08/05/2023 1009   HGB 11.5 (L) 08/05/2023 1009   HGB 14.1 10/13/2020 0807   HCT 36.6 08/05/2023 1009   HCT 42.6 10/13/2020 0807   PLT 327 08/05/2023 1009   PLT 280 10/13/2020 0807   MCV 85.3 08/05/2023 1009   MCV 95 10/13/2020 0807   MCH 26.8 (L) 08/05/2023 1009   MCHC 31.4 (L) 08/05/2023 1009   RDW 13.5 08/05/2023 1009   RDW 11.8 10/13/2020 0807   LYMPHSABS  2.2 10/13/2020 0807   EOSABS 0.2 10/13/2020 0807   BASOSABS 0.1 10/13/2020 0807    Hgb A1C Lab Results  Component Value Date   HGBA1C 5.4 08/05/2023            Assessment & Plan:   Preventative health maintenance:  Flu shot today Tetanus UTD COVID-vaccine UTD Discussed Shingrix vaccine, she will check coverage with her insurance company and schedule visit if she would like to have this done. Pap smear UTD Mammogram UTD Colon screening UTD Encouraged her to consume a balanced diet and exercise regimen Advised her to see an eye doctor and dentist annually We will check CBC, c-Met, TSH, free T4, lipid, A1c today  RTC in 6 months, follow-up chronic conditions Angeline Laura, NP

## 2024-02-28 NOTE — Patient Instructions (Signed)

## 2024-02-29 LAB — LIPID PANEL
Cholesterol: 138 mg/dL (ref <200)
HDL: 44 mg/dL — ABNORMAL LOW (ref >=50)
LDL Cholesterol (Calc): 69 mg/dL
Non-HDL Cholesterol (Calc): 94 mg/dL (ref <130)
Total CHOL/HDL Ratio: 3.1 (calc) (ref <5.0)
Triglycerides: 173 mg/dL — ABNORMAL HIGH (ref <150)

## 2024-02-29 LAB — COMPREHENSIVE METABOLIC PANEL WITH GFR
AG Ratio: 1.8 (calc) (ref 1.0–2.5)
ALT: 14 U/L (ref 6–29)
AST: 15 U/L (ref 10–35)
Albumin: 4 g/dL (ref 3.6–5.1)
Alkaline phosphatase (APISO): 81 U/L (ref 37–153)
BUN: 14 mg/dL (ref 7–25)
CO2: 27 mmol/L (ref 20–32)
Calcium: 8.8 mg/dL (ref 8.6–10.4)
Chloride: 105 mmol/L (ref 98–110)
Creat: 0.87 mg/dL (ref 0.50–1.03)
Globulin: 2.2 g/dL (ref 1.9–3.7)
Glucose, Bld: 97 mg/dL (ref 65–99)
Potassium: 3.9 mmol/L (ref 3.5–5.3)
Sodium: 138 mmol/L (ref 135–146)
Total Bilirubin: 0.3 mg/dL (ref 0.2–1.2)
Total Protein: 6.2 g/dL (ref 6.1–8.1)
eGFR: 81 mL/min/1.73m2 (ref >=60)

## 2024-02-29 LAB — T4, FREE: Free T4: 1 ng/dL (ref 0.8–1.8)

## 2024-02-29 LAB — CBC
HCT: 42 % (ref 35.0–45.0)
Hemoglobin: 14.1 g/dL (ref 11.7–15.5)
MCH: 31.6 pg (ref 27.0–33.0)
MCHC: 33.6 g/dL (ref 32.0–36.0)
MCV: 94.2 fL (ref 80.0–100.0)
MPV: 11.8 fL (ref 7.5–12.5)
Platelets: 282 Thousand/uL (ref 140–400)
RBC: 4.46 Million/uL (ref 3.80–5.10)
RDW: 12.4 % (ref 11.0–15.0)
WBC: 7.2 Thousand/uL (ref 3.8–10.8)

## 2024-02-29 LAB — HEMOGLOBIN A1C
Hgb A1c MFr Bld: 5.2 % (ref <5.7)
Mean Plasma Glucose: 103 mg/dL
eAG (mmol/L): 5.7 mmol/L

## 2024-02-29 LAB — TSH: TSH: 3.79 m[IU]/L

## 2024-03-02 ENCOUNTER — Ambulatory Visit: Payer: Self-pay | Admitting: Internal Medicine

## 2024-03-16 ENCOUNTER — Other Ambulatory Visit: Payer: Self-pay | Admitting: Internal Medicine

## 2024-03-17 NOTE — Telephone Encounter (Signed)
 Requested Prescriptions  Pending Prescriptions Disp Refills   buPROPion  (WELLBUTRIN  XL) 150 MG 24 hr tablet [Pharmacy Med Name: BUPROPION  HCL XL 150 MG TABLET] 90 tablet 1    Sig: TAKE 1 TABLET BY MOUTH EVERY DAY     Psychiatry: Antidepressants - bupropion  Passed - 03/17/2024  3:20 PM      Passed - Cr in normal range and within 360 days    Creat  Date Value Ref Range Status  02/28/2024 0.87 0.50 - 1.03 mg/dL Final         Passed - AST in normal range and within 360 days    AST  Date Value Ref Range Status  02/28/2024 15 10 - 35 U/L Final         Passed - ALT in normal range and within 360 days    ALT  Date Value Ref Range Status  02/28/2024 14 6 - 29 U/L Final         Passed - Completed PHQ-2 or PHQ-9 in the last 360 days      Passed - Last BP in normal range    BP Readings from Last 1 Encounters:  02/28/24 124/78         Passed - Valid encounter within last 6 months    Recent Outpatient Visits           2 weeks ago Encounter for general adult medical examination with abnormal findings   [place] [facility] [place], Angeline ORN, NP   1 month ago Palpitations   [place] [facility] [place], Angeline ORN, NP   2 months ago Palpitations   [place] [facility] [place], [place] W, NP   7 months ago Acquired hypothyroidism   [place] [facility] [place], [place] W, NP               atorvastatin  (LIPITOR) 10 MG tablet [Pharmacy Med Name: ATORVASTATIN  10 MG TABLET] 90 tablet 3    Sig: TAKE 1 TABLET BY MOUTH EVERY DAY     Cardiovascular:  Antilipid - Statins Failed - 03/17/2024  3:20 PM      Failed - Lipid Panel in normal range within the last 12 months    Cholesterol, Total  Date Value Ref Range Status  10/13/2020 216 (H) 100 - 199 mg/dL Final   Cholesterol  Date Value Ref Range Status  02/28/2024 138 <200 mg/dL Final   LDL Cholesterol (Calc)  Date Value Ref Range Status  02/28/2024 69 mg/dL (calc)  Final    Comment:    Reference range: <100 . Desirable range <100 mg/dL for primary prevention;   <70 mg/dL for patients with CHD or diabetic patients  with > or = 2 CHD risk factors. SABRA LDL-C is now calculated using the Martin-Hopkins  calculation, which is a validated novel method providing  better accuracy than the Friedewald equation in the  estimation of LDL-C.  Gladis APPLETHWAITE et al. SANDREA. 7986;689(80): 2061-2068  (http://education.[facility].com/faq/FAQ164)    Direct LDL  Date Value Ref Range Status  09/09/2019 150.0 mg/dL Final    Comment:    Optimal:  <100 mg/dLNear or Above Optimal:  100-129 mg/dLBorderline High:  130-159 mg/dLHigh:  160-189 mg/dLVery High:  >190 mg/dL   HDL  Date Value Ref Range Status  02/28/2024 44 (L) > OR = 50 mg/dL Final  94/73/7977 56 >60 mg/dL Final   Triglycerides  Date Value Ref Range Status  02/28/2024 173 (H) <150 mg/dL  Final         Passed - Patient is not pregnant      Passed - Valid encounter within last 12 months    Recent Outpatient Visits           2 weeks ago Encounter for general adult medical examination with abnormal findings   [place] [facility] [place], Angeline ORN, NP   1 month ago Palpitations   [place], Angeline ORN, NP   2 months ago Palpitations   [place] [facility] [place], Angeline ORN, NP   7 months ago Acquired hypothyroidism    [facility] [place], Angeline ORN, [place]

## 2024-04-09 ENCOUNTER — Ambulatory Visit (INDEPENDENT_AMBULATORY_CARE_PROVIDER_SITE_OTHER): Payer: Self-pay | Admitting: Adult Health

## 2024-04-09 ENCOUNTER — Other Ambulatory Visit: Payer: Self-pay

## 2024-04-09 ENCOUNTER — Encounter: Payer: Self-pay | Admitting: Adult Health

## 2024-04-09 VITALS — BP 121/78 | HR 110 | Temp 98.1°F

## 2024-04-09 DIAGNOSIS — H1032 Unspecified acute conjunctivitis, left eye: Secondary | ICD-10-CM

## 2024-04-09 MED ORDER — ERYTHROMYCIN 5 MG/GM OP OINT: 1.0000 | TOPICAL_OINTMENT | Freq: Every day | OPHTHALMIC | 0 refills | Status: AC

## 2024-04-09 MED ORDER — GENTAMICIN SULFATE 0.3 % OP SOLN: 1.0000 [drp] | OPHTHALMIC | 0 refills | Status: AC

## 2024-04-09 NOTE — Progress Notes (Signed)
 Therapist, [facility] [address] [place], [place] [address]   Office Visit Note  Patient Name: Rachael Holloway Date of Birth [id number]  Medical Record number [id number]  Date of Service: 04/09/2024  Chief Complaint  Patient presents with   Acute Visit    Left eye, started yesterday felt griddy tried washing out with no difference, woke up this morning and states that it is more weepy todayand photo sensitive, states it feels a little sore.  Wear glasses 100% , took claritin thinking it was allergies     HPI Pt is here for a sick visit. She reports since yesterday her left eye has felt gritty, some eye soreness. She has had some congestion, and allergies.  She washed the eye, and took claritin, with no real improvement.  This morning its draining and is photosensitive. Denies fever, chills, headache or ear pain.    Current Medication:  Outpatient Encounter Medications as of 04/09/2024  Medication Sig   atorvastatin  (LIPITOR) 10 MG tablet TAKE 1 TABLET BY MOUTH EVERY DAY   buPROPion  (WELLBUTRIN  XL) 150 MG 24 hr tablet TAKE 1 TABLET BY MOUTH EVERY DAY   erythromycin ophthalmic ointment Place 1 Application into the left eye at bedtime.   fexofenadine (ALLEGRA ALLERGY) 180 MG tablet Take 180 mg by mouth as needed.   fluticasone  (FLONASE ) 50 MCG/ACT nasal spray SPRAY 2 SPRAYS INTO EACH NOSTRIL EVERY DAY   gentamicin (GARAMYCIN) 0.3 % ophthalmic solution Place 1 drop into both eyes every 4 (four) hours.   ibuprofen (ADVIL,MOTRIN) 400 MG tablet Take 400 mg by mouth as needed.   levothyroxine  (SYNTHROID ) 125 MCG tablet TAKE 1 TABLET BY MOUTH EVERY DAY   loratadine (CLARITIN) 10 MG tablet Take 10 mg by mouth daily as needed.   Multiple Vitamin (MULTIVITAMIN) capsule Take 1 capsule by mouth daily.   venlafaxine  XR (EFFEXOR -XR) 75 MG 24 hr capsule TAKE 1 CAPSULE BY MOUTH DAILY WITH BREAKFAST.   [DISCONTINUED] propranolol  (INDERAL ) 10 MG tablet TAKE 1 TABLET BY MOUTH THREE TIMES A DAY  AS NEEDED (Patient not taking: Reported on 02/28/2024)   No facility-administered encounter medications on file as of 04/09/2024.      Medical History: Past Medical History:  Diagnosis Date   Allergy    Anemia    Anxiety    Chicken pox    Depression    Hyperlipidemia    Migraines    Thyroid  disease    White coat hypertension      Vital Signs: BP 121/78   Pulse (!) 110   Temp 98.1 F (36.7 C) (Tympanic)   SpO2 97%    Review of Systems  Constitutional:  Negative for chills, fatigue and fever.  HENT:  Positive for congestion.   Eyes:  Positive for photophobia, pain, redness and itching.  Respiratory:  Negative for cough.     Physical Exam Vitals reviewed.  Constitutional:      Appearance: Normal appearance.  Eyes:     General:        Left eye: Discharge present.    Conjunctiva/sclera:     Left eye: Left conjunctiva is injected. Exudate present.  Neurological:     Mental Status: She is alert.     Assessment/Plan: 1. Acute contagious conjunctivitis of left eye (Primary) Use drops and ointment as discussed. Follow up via MyChart messenger if symptoms fail to improve or may return to clinic as needed for worsening symptoms.   - erythromycin ophthalmic ointment; Place 1 Application into the  left eye at bedtime.  Dispense: 3.5 g; Refill: 0 - gentamicin (GARAMYCIN) 0.3 % ophthalmic solution; Place 1 drop into both eyes every 4 (four) hours.  Dispense: 5 mL; Refill: 0     General Counseling: Janne verbalizes understanding of the findings of todays visit and agrees with plan of treatment. I have discussed any further diagnostic evaluation that may be needed or ordered today. We also reviewed her medications today. she has been encouraged to call the office with any questions or concerns that should arise related to todays visit.   No orders of the defined types were placed in this encounter.   Meds ordered this encounter  Medications   erythromycin ophthalmic  ointment    Sig: Place 1 Application into the left eye at bedtime.    Dispense:  3.5 g    Refill:  0   gentamicin (GARAMYCIN) 0.3 % ophthalmic solution    Sig: Place 1 drop into both eyes every 4 (four) hours.    Dispense:  5 mL    Refill:  0    Time spent:15 Minutes    Juliene DOROTHA Howells AGNP-C Nurse Practitioner

## 2024-04-21 ENCOUNTER — Other Ambulatory Visit: Payer: Self-pay | Admitting: Internal Medicine

## 2024-04-23 NOTE — Telephone Encounter (Signed)
 Requested Prescriptions  Pending Prescriptions Disp Refills   venlafaxine  XR (EFFEXOR -XR) 75 MG 24 hr capsule [Pharmacy Med Name: VENLAFAXINE  HCL ER 75 MG CAP] 90 capsule 0    Sig: TAKE 1 CAPSULE BY MOUTH DAILY WITH BREAKFAST.     Psychiatry: Antidepressants - SNRI - desvenlafaxine & venlafaxine  Failed - 04/23/2024  4:04 PM      Failed - Lipid Panel in normal range within the last 12 months    Cholesterol, Total  Date Value Ref Range Status  10/13/2020 216 (H) 100 - 199 mg/dL Final   Cholesterol  Date Value Ref Range Status  02/28/2024 138 <200 mg/dL Final   LDL Cholesterol (Calc)  Date Value Ref Range Status  02/28/2024 69 mg/dL (calc) Final    Comment:    Reference range: <100 . Desirable range <100 mg/dL for primary prevention;   <70 mg/dL for patients with CHD or diabetic patients  with > or = 2 CHD risk factors. SABRA LDL-C is now calculated using the Martin-Hopkins  calculation, which is a validated novel method providing  better accuracy than the Friedewald equation in the  estimation of LDL-C.  Gladis APPLETHWAITE et al. SANDREA. 7986;689(80): 2061-2068  (http://education.[facility].com/faq/FAQ164)    Direct LDL  Date Value Ref Range Status  09/09/2019 150.0 mg/dL Final    Comment:    Optimal:  <100 mg/dLNear or Above Optimal:  100-129 mg/dLBorderline High:  130-159 mg/dLHigh:  160-189 mg/dLVery High:  >190 mg/dL   HDL  Date Value Ref Range Status  02/28/2024 44 (L) > OR = 50 mg/dL Final  94/73/7977 56 >60 mg/dL Final   Triglycerides  Date Value Ref Range Status  02/28/2024 173 (H) <150 mg/dL Final         Passed - Cr in normal range and within 360 days    Creat  Date Value Ref Range Status  02/28/2024 0.87 0.50 - 1.03 mg/dL Final         Passed - Completed PHQ-2 or PHQ-9 in the last 360 days      Passed - Last BP in normal range    BP Readings from Last 1 Encounters:  04/09/24 121/78         Passed - Valid encounter within last 6 months    Recent  Outpatient Visits           1 month ago Encounter for general adult medical examination with abnormal findings   [place] [facility] [place], Angeline ORN, NP   2 months ago Palpitations   [place] [facility] [place], Angeline ORN, NP   3 months ago Palpitations   [place] [facility] [place], Angeline ORN, NP   8 months ago Acquired hypothyroidism   [place] [facility] [place], Angeline ORN, [place]

## 2024-04-24 ENCOUNTER — Encounter: Payer: Self-pay | Admitting: Internal Medicine

## 2024-05-17 ENCOUNTER — Other Ambulatory Visit: Payer: Self-pay | Admitting: Internal Medicine

## 2024-05-19 NOTE — Telephone Encounter (Signed)
 Requested Prescriptions  Pending Prescriptions Disp Refills   fluticasone  (FLONASE ) 50 MCG/ACT nasal spray [Pharmacy Med Name: FLUTICASONE  PROP 50 MCG SPRAY] 48 mL 0    Sig: SPRAY 2 SPRAYS INTO EACH NOSTRIL EVERY DAY     Ear, Nose, and Throat: Nasal Preparations - Corticosteroids Passed - 05/19/2024 12:05 PM      Passed - Valid encounter within last 12 months    Recent Outpatient Visits           2 months ago Encounter for general adult medical examination with abnormal findings   [place] [facility] [place], Angeline ORN, NP   3 months ago Palpitations   [place] [facility] [place], Angeline ORN, NP   4 months ago Palpitations   [place] [facility] [place], Angeline ORN, NP   9 months ago Acquired hypothyroidism   [place] [facility] [place], Angeline ORN, [place]

## 2024-06-15 ENCOUNTER — Ambulatory Visit: Admitting: Psychology

## 2024-06-15 DIAGNOSIS — F3289 Other specified depressive episodes: Secondary | ICD-10-CM | POA: Diagnosis not present

## 2024-06-15 DIAGNOSIS — F418 Other specified anxiety disorders: Secondary | ICD-10-CM
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
## 2024-07-20 ENCOUNTER — Encounter

## 2024-08-31 ENCOUNTER — Ambulatory Visit: Admitting: Internal Medicine
